# Patient Record
Sex: Female | Born: 1945 | Race: White | Hispanic: No | Marital: Single | Smoking: Former smoker
Health system: Southern US, Community
[De-identification: ages and names within clinical notes are randomized; demographics above are authoritative.]

## PROBLEM LIST (undated history)

## (undated) DIAGNOSIS — K5900 Constipation, unspecified: Secondary | ICD-10-CM

## (undated) DIAGNOSIS — Z9189 Other specified personal risk factors, not elsewhere classified: Secondary | ICD-10-CM

## (undated) DIAGNOSIS — K648 Other hemorrhoids: Secondary | ICD-10-CM

## (undated) DIAGNOSIS — G43909 Migraine, unspecified, not intractable, without status migrainosus: Secondary | ICD-10-CM

## (undated) DIAGNOSIS — Z8619 Personal history of other infectious and parasitic diseases: Secondary | ICD-10-CM

## (undated) DIAGNOSIS — A069 Amebiasis, unspecified: Secondary | ICD-10-CM

## (undated) DIAGNOSIS — B009 Herpesviral infection, unspecified: Secondary | ICD-10-CM

## (undated) DIAGNOSIS — R197 Diarrhea, unspecified: Secondary | ICD-10-CM

## (undated) DIAGNOSIS — R002 Palpitations: Secondary | ICD-10-CM

## (undated) HISTORY — DX: Diarrhea, unspecified: R19.7

## (undated) HISTORY — DX: Personal history of other infectious and parasitic diseases: Z86.19

## (undated) HISTORY — PX: NO PAST SURGERIES: SHX2092

## (undated) HISTORY — DX: Migraine, unspecified, not intractable, without status migrainosus: G43.909

## (undated) HISTORY — DX: Other specified personal risk factors, not elsewhere classified: Z91.89

## (undated) HISTORY — DX: Amebiasis, unspecified: A06.9

## (undated) HISTORY — DX: Palpitations: R00.2

## (undated) HISTORY — DX: Herpesviral infection, unspecified: B00.9

## (undated) HISTORY — DX: Constipation, unspecified: K59.00

## (undated) HISTORY — DX: Other hemorrhoids: K64.8

---

## 2001-01-30 ENCOUNTER — Other Ambulatory Visit: Admission: RE | Admit: 2001-01-30 | Discharge: 2001-01-30 | Payer: Self-pay | Admitting: Obstetrics & Gynecology

## 2001-08-07 ENCOUNTER — Encounter: Payer: Self-pay | Admitting: Obstetrics & Gynecology

## 2001-08-07 ENCOUNTER — Encounter: Admission: RE | Admit: 2001-08-07 | Discharge: 2001-08-07 | Payer: Self-pay | Admitting: Obstetrics & Gynecology

## 2002-04-15 ENCOUNTER — Other Ambulatory Visit: Admission: RE | Admit: 2002-04-15 | Discharge: 2002-04-15 | Payer: Self-pay | Admitting: Obstetrics & Gynecology

## 2002-04-18 ENCOUNTER — Encounter: Admission: RE | Admit: 2002-04-18 | Discharge: 2002-04-18 | Payer: Self-pay | Admitting: Family Medicine

## 2002-04-18 ENCOUNTER — Encounter: Payer: Self-pay | Admitting: Obstetrics & Gynecology

## 2003-05-14 ENCOUNTER — Encounter: Payer: Self-pay | Admitting: Internal Medicine

## 2003-06-19 ENCOUNTER — Encounter (INDEPENDENT_AMBULATORY_CARE_PROVIDER_SITE_OTHER): Payer: Self-pay | Admitting: *Deleted

## 2003-06-19 ENCOUNTER — Ambulatory Visit (HOSPITAL_COMMUNITY): Admission: RE | Admit: 2003-06-19 | Discharge: 2003-06-19 | Payer: Self-pay | Admitting: Gastroenterology

## 2003-06-23 ENCOUNTER — Other Ambulatory Visit: Admission: RE | Admit: 2003-06-23 | Discharge: 2003-06-23 | Payer: Self-pay | Admitting: Obstetrics & Gynecology

## 2004-10-19 ENCOUNTER — Ambulatory Visit: Payer: Self-pay | Admitting: Psychology

## 2005-10-13 ENCOUNTER — Encounter: Admission: RE | Admit: 2005-10-13 | Discharge: 2005-10-13 | Payer: Self-pay | Admitting: Obstetrics & Gynecology

## 2006-12-28 ENCOUNTER — Encounter: Admission: RE | Admit: 2006-12-28 | Discharge: 2006-12-28 | Payer: Self-pay | Admitting: Obstetrics & Gynecology

## 2007-06-27 ENCOUNTER — Encounter: Payer: Self-pay | Admitting: Internal Medicine

## 2008-01-07 ENCOUNTER — Encounter: Payer: Self-pay | Admitting: Internal Medicine

## 2008-01-08 ENCOUNTER — Encounter: Admission: RE | Admit: 2008-01-08 | Discharge: 2008-01-08 | Payer: Self-pay | Admitting: Obstetrics & Gynecology

## 2008-12-24 ENCOUNTER — Encounter: Payer: Self-pay | Admitting: Internal Medicine

## 2008-12-28 ENCOUNTER — Telehealth: Payer: Self-pay | Admitting: Internal Medicine

## 2009-01-08 ENCOUNTER — Encounter: Admission: RE | Admit: 2009-01-08 | Discharge: 2009-01-08 | Payer: Self-pay | Admitting: Obstetrics & Gynecology

## 2009-03-01 DIAGNOSIS — A069 Amebiasis, unspecified: Secondary | ICD-10-CM | POA: Insufficient documentation

## 2009-03-01 DIAGNOSIS — Z9189 Other specified personal risk factors, not elsewhere classified: Secondary | ICD-10-CM | POA: Insufficient documentation

## 2009-03-01 DIAGNOSIS — K648 Other hemorrhoids: Secondary | ICD-10-CM

## 2009-03-01 DIAGNOSIS — G43909 Migraine, unspecified, not intractable, without status migrainosus: Secondary | ICD-10-CM

## 2009-03-01 DIAGNOSIS — B009 Herpesviral infection, unspecified: Secondary | ICD-10-CM

## 2009-03-01 DIAGNOSIS — K5900 Constipation, unspecified: Secondary | ICD-10-CM

## 2009-03-01 DIAGNOSIS — Z8619 Personal history of other infectious and parasitic diseases: Secondary | ICD-10-CM

## 2009-03-01 DIAGNOSIS — R197 Diarrhea, unspecified: Secondary | ICD-10-CM

## 2009-03-01 HISTORY — DX: Other hemorrhoids: K64.8

## 2009-03-01 HISTORY — DX: Amebiasis, unspecified: A06.9

## 2009-03-01 HISTORY — DX: Diarrhea, unspecified: R19.7

## 2009-03-01 HISTORY — DX: Personal history of other infectious and parasitic diseases: Z86.19

## 2009-03-01 HISTORY — DX: Migraine, unspecified, not intractable, without status migrainosus: G43.909

## 2009-03-01 HISTORY — DX: Herpesviral infection, unspecified: B00.9

## 2009-03-01 HISTORY — DX: Constipation, unspecified: K59.00

## 2009-03-01 HISTORY — DX: Other specified personal risk factors, not elsewhere classified: Z91.89

## 2009-04-23 ENCOUNTER — Ambulatory Visit: Payer: Self-pay | Admitting: Internal Medicine

## 2010-01-28 ENCOUNTER — Encounter: Admission: RE | Admit: 2010-01-28 | Discharge: 2010-01-28 | Payer: Self-pay | Admitting: Obstetrics & Gynecology

## 2010-09-23 NOTE — Op Note (Signed)
NAME:  Kayla Lynch, Kayla Lynch                        ACCOUNT NO.:  [id number]   MEDICAL RECORD NO.:  [id number]                   PATIENT TYPE:  AMB   LOCATION:  ENDO                                 FACILITY:  [facility]   PHYSICIAN:  Anselmo Rod, M.D.               DATE OF BIRTH:  [birth year]   DATE OF PROCEDURE:  06/19/2003  DATE OF DISCHARGE:                                 OPERATIVE REPORT   PROCEDURE:  Screening colonoscopy.   ENDOSCOPIST:  Anselmo Rod, M.D.   INSTRUMENT USED:  Olympus video colonoscope.   INDICATIONS FOR PROCEDURE:  A 65 year old white female with a history of  change  in bowel habits, undergoing screening colonoscopy to rule out  colonic polyps, masses, etc.   PREPROCEDURE PREPARATION:  Informed consent was obtained from the patient  and the patient was fasted for 8 hours prior to  the procedure and prepped  with a bottle of magnesium citrate and a gallon of GoLYTELY the  night prior  to the procedure.   PREPROCEDURE PHYSICAL:  The patient had stable vital signs. Neck supple.  Chest clear to auscultation, S1, S2 regular. Abdomen soft with normoactive  bowel sounds.   DESCRIPTION OF PROCEDURE:  The patient was placed in the left lateral  decubitus position and sedated with 60 mg of Demerol and 5 mg of Versed  intravenously. Once sedation was adequate, the patient was maintained on low  flow oxygen and continuous cardiac monitoring.   The Olympus video colonoscope was advanced into the rectum to the cecum with  difficulty. There was a large amount of residual stool into colon. Multiple  washings were done. The terminal ileum appeared  healthy without lesions.  Small  internal hemorrhoids were seen on retroflexion. No masses or polyps  were identified. There was no evidence of diverticulosis. The patient  tolerated the procedure well without complications.   IMPRESSION:  1. Unrevealing colonoscopy except for small  internal hemorrhoids.  2. No masses polyps  or diverticulosis seen.  3. Large amount of residual stool in the colon. Small lesions could have     been missed.  4. Normal terminal ileum.   RECOMMENDATIONS:  1. Continue a high fiber diet with liberal fluid intake.  2. Repeat colorectal cancer screening in the next 5 years unless the patient     develops any abnormal symptoms in the interim.  3. Outpatient follow up in the next 2 weeks or earlier if need arises.                                               Anselmo Rod, M.D.    JNM/MEDQ  D:  06/19/2003  T:  06/19/2003  Job:  [id number]   cc:   Duwayne Heck L. Mahaffey, M.D.  [address]  [place]  [place] [address]  Fax: [phone]

## 2011-01-02 ENCOUNTER — Other Ambulatory Visit: Payer: Self-pay | Admitting: Obstetrics & Gynecology

## 2011-01-02 DIAGNOSIS — Z1231 Encounter for screening mammogram for malignant neoplasm of breast: Secondary | ICD-10-CM

## 2011-02-06 ENCOUNTER — Ambulatory Visit
Admission: RE | Admit: 2011-02-06 | Discharge: 2011-02-06 | Disposition: A | Discharge status: Home / Self Care | Payer: No Typology Code available for payment source | Source: Ambulatory Visit | Attending: Obstetrics & Gynecology | Admitting: Obstetrics & Gynecology

## 2011-02-06 DIAGNOSIS — Z1231 Encounter for screening mammogram for malignant neoplasm of breast: Secondary | ICD-10-CM

## 2012-03-27 ENCOUNTER — Other Ambulatory Visit: Payer: Self-pay | Admitting: Obstetrics & Gynecology

## 2012-03-27 DIAGNOSIS — Z1231 Encounter for screening mammogram for malignant neoplasm of breast: Secondary | ICD-10-CM

## 2012-05-15 ENCOUNTER — Ambulatory Visit
Admission: RE | Admit: 2012-05-15 | Discharge: 2012-05-15 | Disposition: A | Discharge status: Home / Self Care | Payer: BC Managed Care – PPO | Source: Ambulatory Visit | Attending: Obstetrics & Gynecology | Admitting: Obstetrics & Gynecology

## 2012-05-15 DIAGNOSIS — Z1231 Encounter for screening mammogram for malignant neoplasm of breast: Secondary | ICD-10-CM

## 2012-06-18 ENCOUNTER — Emergency Department (HOSPITAL_COMMUNITY)
Admission: EM | Admit: 2012-06-18 | Discharge: 2012-06-19 | Disposition: A | Discharge status: Home / Self Care | Payer: BC Managed Care – PPO | Attending: Emergency Medicine | Admitting: Emergency Medicine

## 2012-06-18 ENCOUNTER — Encounter (HOSPITAL_COMMUNITY): Payer: Self-pay | Admitting: *Deleted

## 2012-06-18 DIAGNOSIS — K089 Disorder of teeth and supporting structures, unspecified: Secondary | ICD-10-CM | POA: Insufficient documentation

## 2012-06-18 DIAGNOSIS — K0889 Other specified disorders of teeth and supporting structures: Secondary | ICD-10-CM

## 2012-06-18 DIAGNOSIS — R5381 Other malaise: Secondary | ICD-10-CM | POA: Insufficient documentation

## 2012-06-18 DIAGNOSIS — R112 Nausea with vomiting, unspecified: Secondary | ICD-10-CM

## 2012-06-18 DIAGNOSIS — R5383 Other fatigue: Secondary | ICD-10-CM | POA: Insufficient documentation

## 2012-06-18 DIAGNOSIS — E860 Dehydration: Secondary | ICD-10-CM

## 2012-06-18 MED ORDER — FENTANYL CITRATE 0.05 MG/ML IJ SOLN
50.0000 ug | Freq: Once | INTRAMUSCULAR | Status: AC
  Administered 2012-06-19: 50 ug via INTRAVENOUS
  Filled 2012-06-18: qty 2

## 2012-06-18 MED ORDER — SODIUM CHLORIDE 0.9 % IV BOLUS (SEPSIS)
1000.0000 mL | Freq: Once | INTRAVENOUS | Status: AC
  Administered 2012-06-19: 1000 mL via INTRAVENOUS

## 2012-06-18 MED ORDER — ONDANSETRON HCL 4 MG/2ML IJ SOLN
4.0000 mg | Freq: Once | INTRAMUSCULAR | Status: AC
  Administered 2012-06-19: 4 mg via INTRAVENOUS
  Filled 2012-06-18: qty 2

## 2012-06-18 NOTE — ED Notes (Signed)
Pt c/o having toothache on Thursday; worse yesterday; seen by dentist this am--cracked tooth--given RX for pain pill and antibiotic; drove home from [place] today; started vomiting on way home; weakness; feels dehydrated

## 2012-06-19 LAB — POCT I-STAT, CHEM 8
BUN: 7 mg/dL (ref 6–23)
Chloride: 99 mEq/L (ref 96–112)
Creatinine, Ser: 0.6 mg/dL (ref 0.50–1.10)
Potassium: 3.5 mEq/L (ref 3.5–5.1)
Sodium: 134 mEq/L — ABNORMAL LOW (ref 135–145)

## 2012-06-19 LAB — URINALYSIS, ROUTINE W REFLEX MICROSCOPIC
Glucose, UA: NEGATIVE mg/dL
Hgb urine dipstick: NEGATIVE
Protein, ur: NEGATIVE mg/dL
Specific Gravity, Urine: 1.016 (ref 1.005–1.030)
pH: 7.5 (ref 5.0–8.0)

## 2012-06-19 MED ORDER — MORPHINE SULFATE 4 MG/ML IJ SOLN
4.0000 mg | Freq: Once | INTRAMUSCULAR | Status: AC
  Administered 2012-06-19: 4 mg via INTRAVENOUS
  Filled 2012-06-19: qty 1

## 2012-06-19 MED ORDER — OXYCODONE-ACETAMINOPHEN 5-325 MG PO TABS: 2.0000 | ORAL_TABLET | ORAL | Status: DC | PRN

## 2012-06-19 MED ORDER — KETOROLAC TROMETHAMINE 30 MG/ML IJ SOLN
30.0000 mg | Freq: Once | INTRAMUSCULAR | Status: AC
  Administered 2012-06-19: 30 mg via INTRAVENOUS
  Filled 2012-06-19: qty 1

## 2012-06-19 MED ORDER — ONDANSETRON HCL 4 MG PO TABS: 4.0000 mg | ORAL_TABLET | Freq: Four times a day (QID) | ORAL | Status: DC

## 2012-06-19 MED ORDER — SODIUM CHLORIDE 0.9 % IV BOLUS (SEPSIS)
1000.0000 mL | Freq: Once | INTRAVENOUS | Status: AC
  Administered 2012-06-19: 1000 mL via INTRAVENOUS

## 2012-06-19 MED ORDER — CLINDAMYCIN PHOSPHATE 600 MG/50ML IV SOLN
600.0000 mg | Freq: Once | INTRAVENOUS | Status: AC
  Administered 2012-06-19: 600 mg via INTRAVENOUS
  Filled 2012-06-19: qty 50

## 2012-06-19 MED ORDER — PROMETHAZINE HCL 25 MG PO TABS: 25.0000 mg | ORAL_TABLET | Freq: Four times a day (QID) | ORAL | Status: DC | PRN

## 2012-06-19 NOTE — ED Provider Notes (Signed)
History     CSN: [id number]  Arrival date & time 06/18/12  2336   First MD Initiated Contact with Patient 06/18/12 2349      Chief Complaint  Patient presents with  . Emesis    (Consider location/radiation/quality/duration/timing/severity/associated sxs/prior treatment) HPI D67-year-old female presents to emergency room complaining of nausea and vomiting and tooth pain. Patient was seen at dentist or earlier today, told she had a cracked tooth. She was placed on Vicodin and Cleocin. Patient has taken a couple doses of each, and during drive home from [place] developed nausea and vomiting. Patient feels dehydrated. She denies any fever. Pain to left lower tooth. She's also had some soft tissue swelling under her jaw. She was told by the dentist earlier today, that she probably has an abscess. She has followup with her own dentist at 9 AM later today. No abdominal pain, no diarrhea. No unusual foods, no sick contacts. Patient has not taken clindamycin or Vicodin in the past    History reviewed. No pertinent past medical history.  History reviewed. No pertinent past surgical history.  No family history on file.  History  Substance Use Topics  . Smoking status: Never Smoker   . Smokeless tobacco: Never Used  . Alcohol Use: Yes     Comment: socially    OB History   Grav Para Term Preterm Abortions TAB SAB Ect Mult Living                  Review of Systems  See History of Present Illness; otherwise all other systems are reviewed and negative  Allergies  Review of patient's allergies indicates no known allergies.  Home Medications   Current Outpatient Rx  Name  Route  Sig  Dispense  Refill  . clindamycin (CLEOCIN) 150 MG capsule   Oral   Take 150 mg by mouth 3 (three) times daily.         Marland Kitchen estradiol (CLIMARA - DOSED IN MG/24 HR) 0.075 mg/24hr   Transdermal   Place 1 patch onto the skin once a week.         Marland Kitchen HYDROcodone-acetaminophen (NORCO/VICODIN) 5-325 MG per  tablet   Oral   Take 1 tablet by mouth every 6 (six) hours as needed for pain.         . progesterone (PROMETRIUM) 100 MG capsule   Oral   Take 100 mg by mouth daily.           BP 159/72  Pulse 108  Temp(Src) 98 F (36.7 C) (Oral)  Resp 20  SpO2 97%  Physical Exam  Nursing note and vitals reviewed. Constitutional: She is oriented to person, place, and time. She appears well-developed and well-nourished.  HENT:  Head: Normocephalic and atraumatic.  Right Ear: External ear normal.  Left Ear: External ear normal.  Nose: Nose normal.  Mouth/Throat: Oropharynx is clear and moist.  No buccal swelling, no obvious abscess or edema noted  Eyes: Conjunctivae and EOM are normal. Pupils are equal, round, and reactive to light.  Neck: Normal range of motion. Neck supple. No JVD present. No tracheal deviation present. No thyromegaly present.  Some lymphadenopathy along left submental and paracervical region  Cardiovascular: Normal rate, regular rhythm, normal heart sounds and intact distal pulses.  Exam reveals no gallop and no friction rub.   No murmur heard. Pulmonary/Chest: Effort normal and breath sounds normal. No stridor. No respiratory distress. She has no wheezes. She has no rales. She exhibits no tenderness.  Abdominal: Soft. Bowel sounds are normal. She exhibits no distension and no mass. There is no tenderness. There is no rebound and no guarding.  Musculoskeletal: Normal range of motion. She exhibits no edema and no tenderness.  Lymphadenopathy:    She has no cervical adenopathy.  Neurological: She is alert and oriented to person, place, and time. She exhibits normal muscle tone. Coordination normal.  Skin: Skin is warm and dry. No rash noted. No erythema. No pallor.  Psychiatric: She has a normal mood and affect. Her behavior is normal. Judgment and thought content normal.    ED Course  Procedures (including critical care time)  Labs Reviewed  URINALYSIS, ROUTINE W  REFLEX MICROSCOPIC - Abnormal; Notable for the following:    Ketones, ur 40 (*)    All other components within normal limits  POCT I-STAT, CHEM 8 - Abnormal; Notable for the following:    Sodium 134 (*)    Glucose, Bld 138 (*)    All other components within normal limits   No results found.   1. Nausea and vomiting   2. Pain, dental   3. Dehydration       MDM  67 year old female with dental pain with new medications that most likely caused her nausea and vomiting today. We'll give IV fluids and nausea medicine.   Patient reports worsening of dental pain after fentanyl were offered. We'll give a dose of Toradol and morphine. Patient has not taken a dose of clindamycin since 3 PM, will give an IV dose.       Olivia Mackie, MD 06/19/12 [phone]

## 2013-05-13 ENCOUNTER — Other Ambulatory Visit: Payer: Self-pay

## 2013-05-13 DIAGNOSIS — Z1231 Encounter for screening mammogram for malignant neoplasm of breast: Secondary | ICD-10-CM

## 2013-05-23 ENCOUNTER — Ambulatory Visit: Payer: Self-pay | Admitting: Podiatrist

## 2013-05-26 DIAGNOSIS — B373 Candidiasis of vulva and vagina: Secondary | ICD-10-CM | POA: Diagnosis not present

## 2013-05-26 DIAGNOSIS — B3731 Acute candidiasis of vulva and vagina: Secondary | ICD-10-CM | POA: Diagnosis not present

## 2013-06-03 ENCOUNTER — Ambulatory Visit: Payer: BC Managed Care – PPO

## 2013-06-09 ENCOUNTER — Ambulatory Visit
Admission: RE | Admit: 2013-06-09 | Discharge: 2013-06-09 | Disposition: A | Discharge status: Home / Self Care | Payer: Medicare Other | Source: Ambulatory Visit

## 2013-06-09 DIAGNOSIS — Z1231 Encounter for screening mammogram for malignant neoplasm of breast: Secondary | ICD-10-CM | POA: Diagnosis not present

## 2013-06-11 ENCOUNTER — Ambulatory Visit: Payer: Self-pay | Admitting: Podiatrist

## 2013-06-24 ENCOUNTER — Ambulatory Visit
Admission: RE | Admit: 2013-06-24 | Discharge: 2013-06-24 | Disposition: A | Discharge status: Home / Self Care | Payer: Medicare Other | Source: Ambulatory Visit | Attending: Family Medicine | Admitting: Family Medicine

## 2013-06-24 ENCOUNTER — Other Ambulatory Visit: Payer: Self-pay | Admitting: Family Medicine

## 2013-06-24 DIAGNOSIS — R0789 Other chest pain: Secondary | ICD-10-CM

## 2013-06-24 DIAGNOSIS — R0602 Shortness of breath: Secondary | ICD-10-CM | POA: Diagnosis not present

## 2013-06-30 DIAGNOSIS — Z01419 Encounter for gynecological examination (general) (routine) without abnormal findings: Secondary | ICD-10-CM | POA: Diagnosis not present

## 2013-06-30 DIAGNOSIS — Z124 Encounter for screening for malignant neoplasm of cervix: Secondary | ICD-10-CM | POA: Diagnosis not present

## 2013-07-07 DIAGNOSIS — Z13220 Encounter for screening for lipoid disorders: Secondary | ICD-10-CM | POA: Diagnosis not present

## 2013-07-07 DIAGNOSIS — Z1389 Encounter for screening for other disorder: Secondary | ICD-10-CM | POA: Diagnosis not present

## 2013-07-07 DIAGNOSIS — Z0000 Encounter for general adult medical examination without abnormal findings: Secondary | ICD-10-CM | POA: Diagnosis not present

## 2014-01-21 DIAGNOSIS — B351 Tinea unguium: Secondary | ICD-10-CM | POA: Diagnosis not present

## 2014-01-21 DIAGNOSIS — L259 Unspecified contact dermatitis, unspecified cause: Secondary | ICD-10-CM | POA: Diagnosis not present

## 2014-01-21 DIAGNOSIS — L821 Other seborrheic keratosis: Secondary | ICD-10-CM | POA: Diagnosis not present

## 2014-02-18 DIAGNOSIS — B349 Viral infection, unspecified: Secondary | ICD-10-CM | POA: Diagnosis not present

## 2014-02-18 DIAGNOSIS — Z23 Encounter for immunization: Secondary | ICD-10-CM | POA: Diagnosis not present

## 2014-02-23 DIAGNOSIS — J101 Influenza due to other identified influenza virus with other respiratory manifestations: Secondary | ICD-10-CM | POA: Diagnosis not present

## 2014-02-27 ENCOUNTER — Encounter: Payer: Self-pay | Admitting: *Deleted

## 2014-03-05 DIAGNOSIS — J029 Acute pharyngitis, unspecified: Secondary | ICD-10-CM | POA: Diagnosis not present

## 2014-03-05 DIAGNOSIS — R52 Pain, unspecified: Secondary | ICD-10-CM | POA: Diagnosis not present

## 2014-03-05 DIAGNOSIS — R079 Chest pain, unspecified: Secondary | ICD-10-CM | POA: Diagnosis not present

## 2014-07-28 ENCOUNTER — Other Ambulatory Visit: Payer: Self-pay

## 2014-07-28 DIAGNOSIS — Z1231 Encounter for screening mammogram for malignant neoplasm of breast: Secondary | ICD-10-CM

## 2014-08-10 ENCOUNTER — Ambulatory Visit
Admission: RE | Admit: 2014-08-10 | Discharge: 2014-08-10 | Disposition: A | Discharge status: Home / Self Care | Payer: Medicare Other | Source: Ambulatory Visit

## 2014-08-10 ENCOUNTER — Encounter (INDEPENDENT_AMBULATORY_CARE_PROVIDER_SITE_OTHER): Payer: Self-pay

## 2014-08-10 DIAGNOSIS — Z1231 Encounter for screening mammogram for malignant neoplasm of breast: Secondary | ICD-10-CM | POA: Diagnosis not present

## 2014-08-24 DIAGNOSIS — J209 Acute bronchitis, unspecified: Secondary | ICD-10-CM | POA: Diagnosis not present

## 2014-08-24 DIAGNOSIS — J329 Chronic sinusitis, unspecified: Secondary | ICD-10-CM | POA: Diagnosis not present

## 2014-09-03 DIAGNOSIS — Z124 Encounter for screening for malignant neoplasm of cervix: Secondary | ICD-10-CM | POA: Diagnosis not present

## 2014-09-03 DIAGNOSIS — R87610 Atypical squamous cells of undetermined significance on cytologic smear of cervix (ASC-US): Secondary | ICD-10-CM | POA: Diagnosis not present

## 2014-09-03 DIAGNOSIS — Z01419 Encounter for gynecological examination (general) (routine) without abnormal findings: Secondary | ICD-10-CM | POA: Diagnosis not present

## 2014-09-25 DIAGNOSIS — J329 Chronic sinusitis, unspecified: Secondary | ICD-10-CM | POA: Diagnosis not present

## 2014-10-12 IMAGING — CR DG CHEST 2V
2 series · 2 of 2 positions shown · non-contrast
Comparison: 04/15/2009

CLINICAL DATA: Shortness of Breath

EXAM:
CHEST  2 VIEW

[w chest pa]
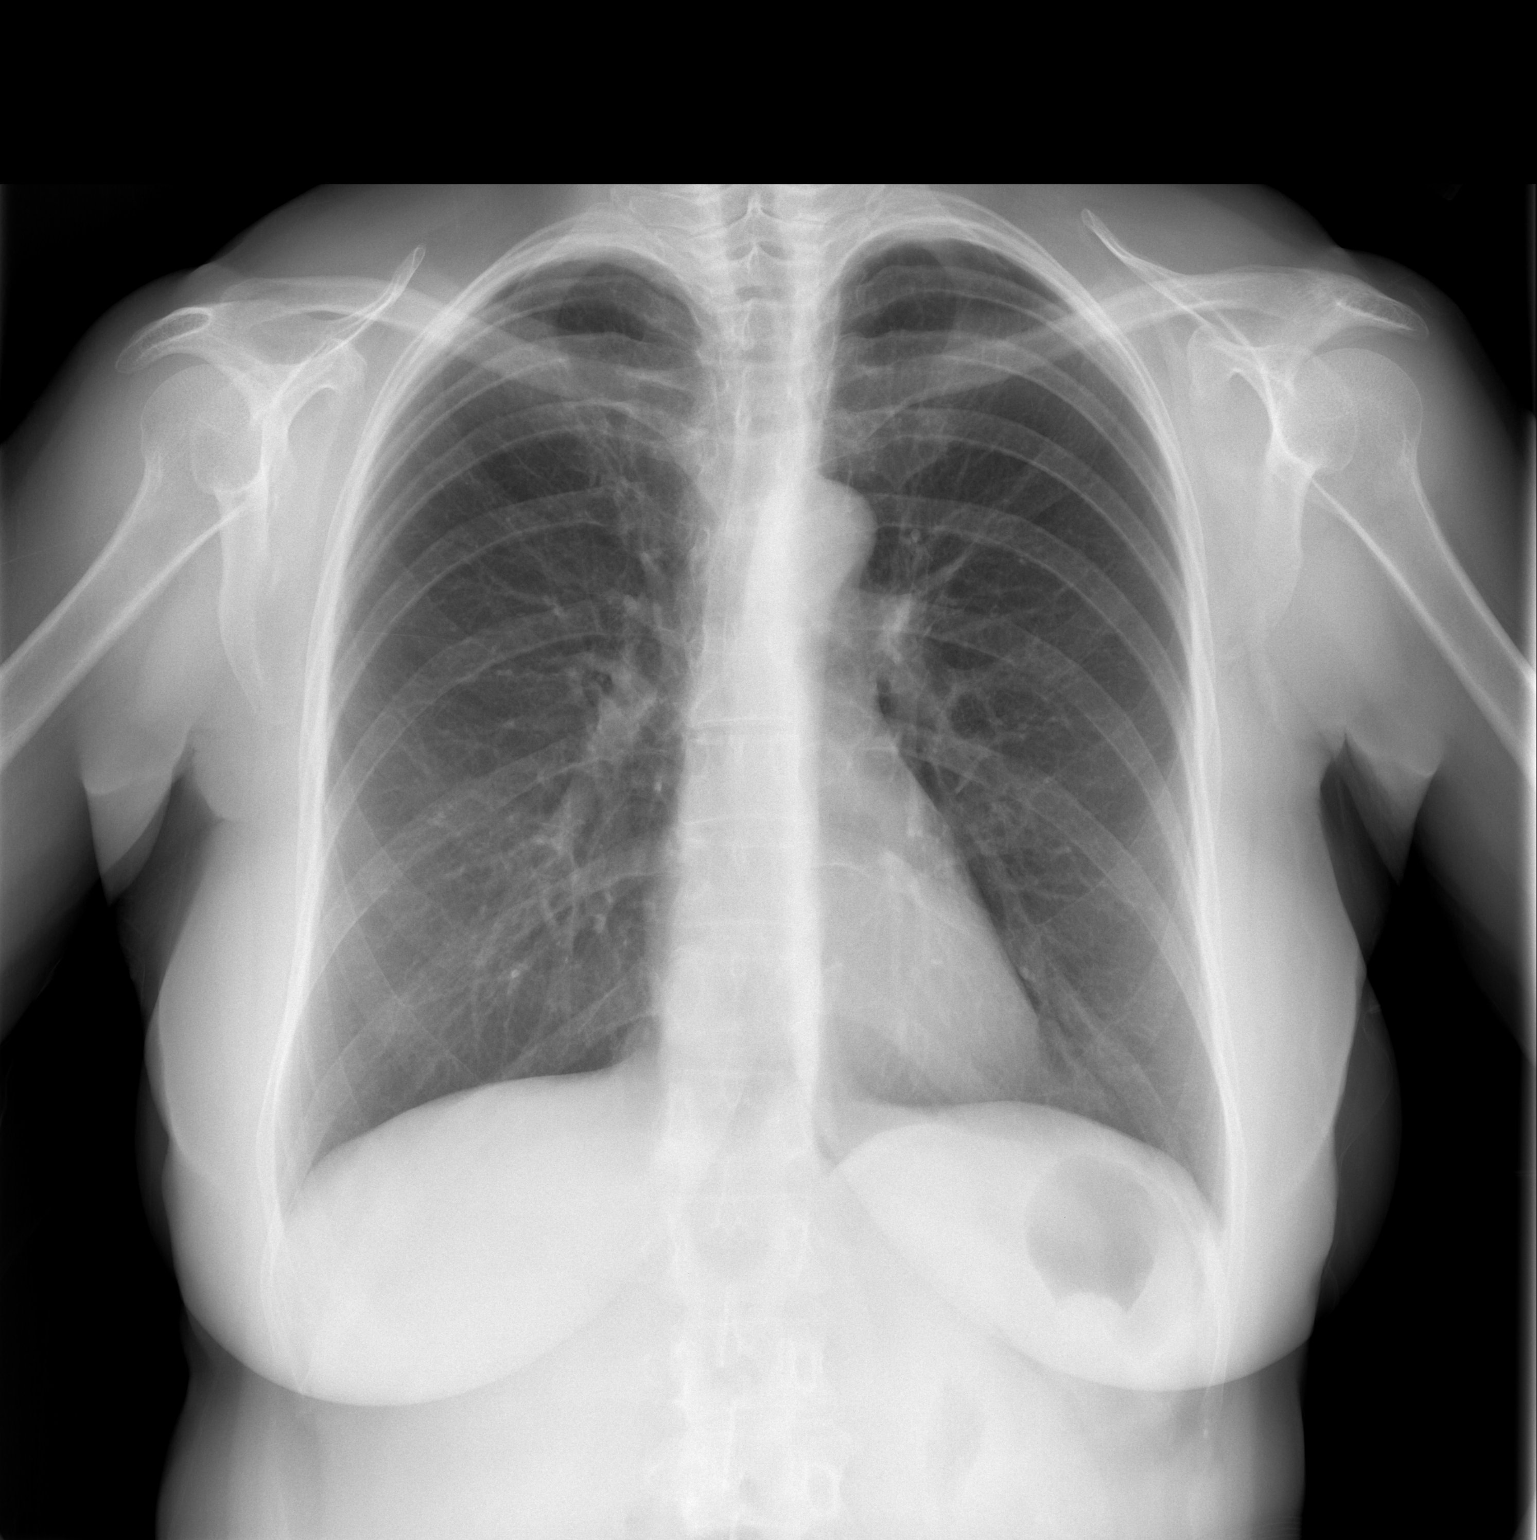

[w chest lat]
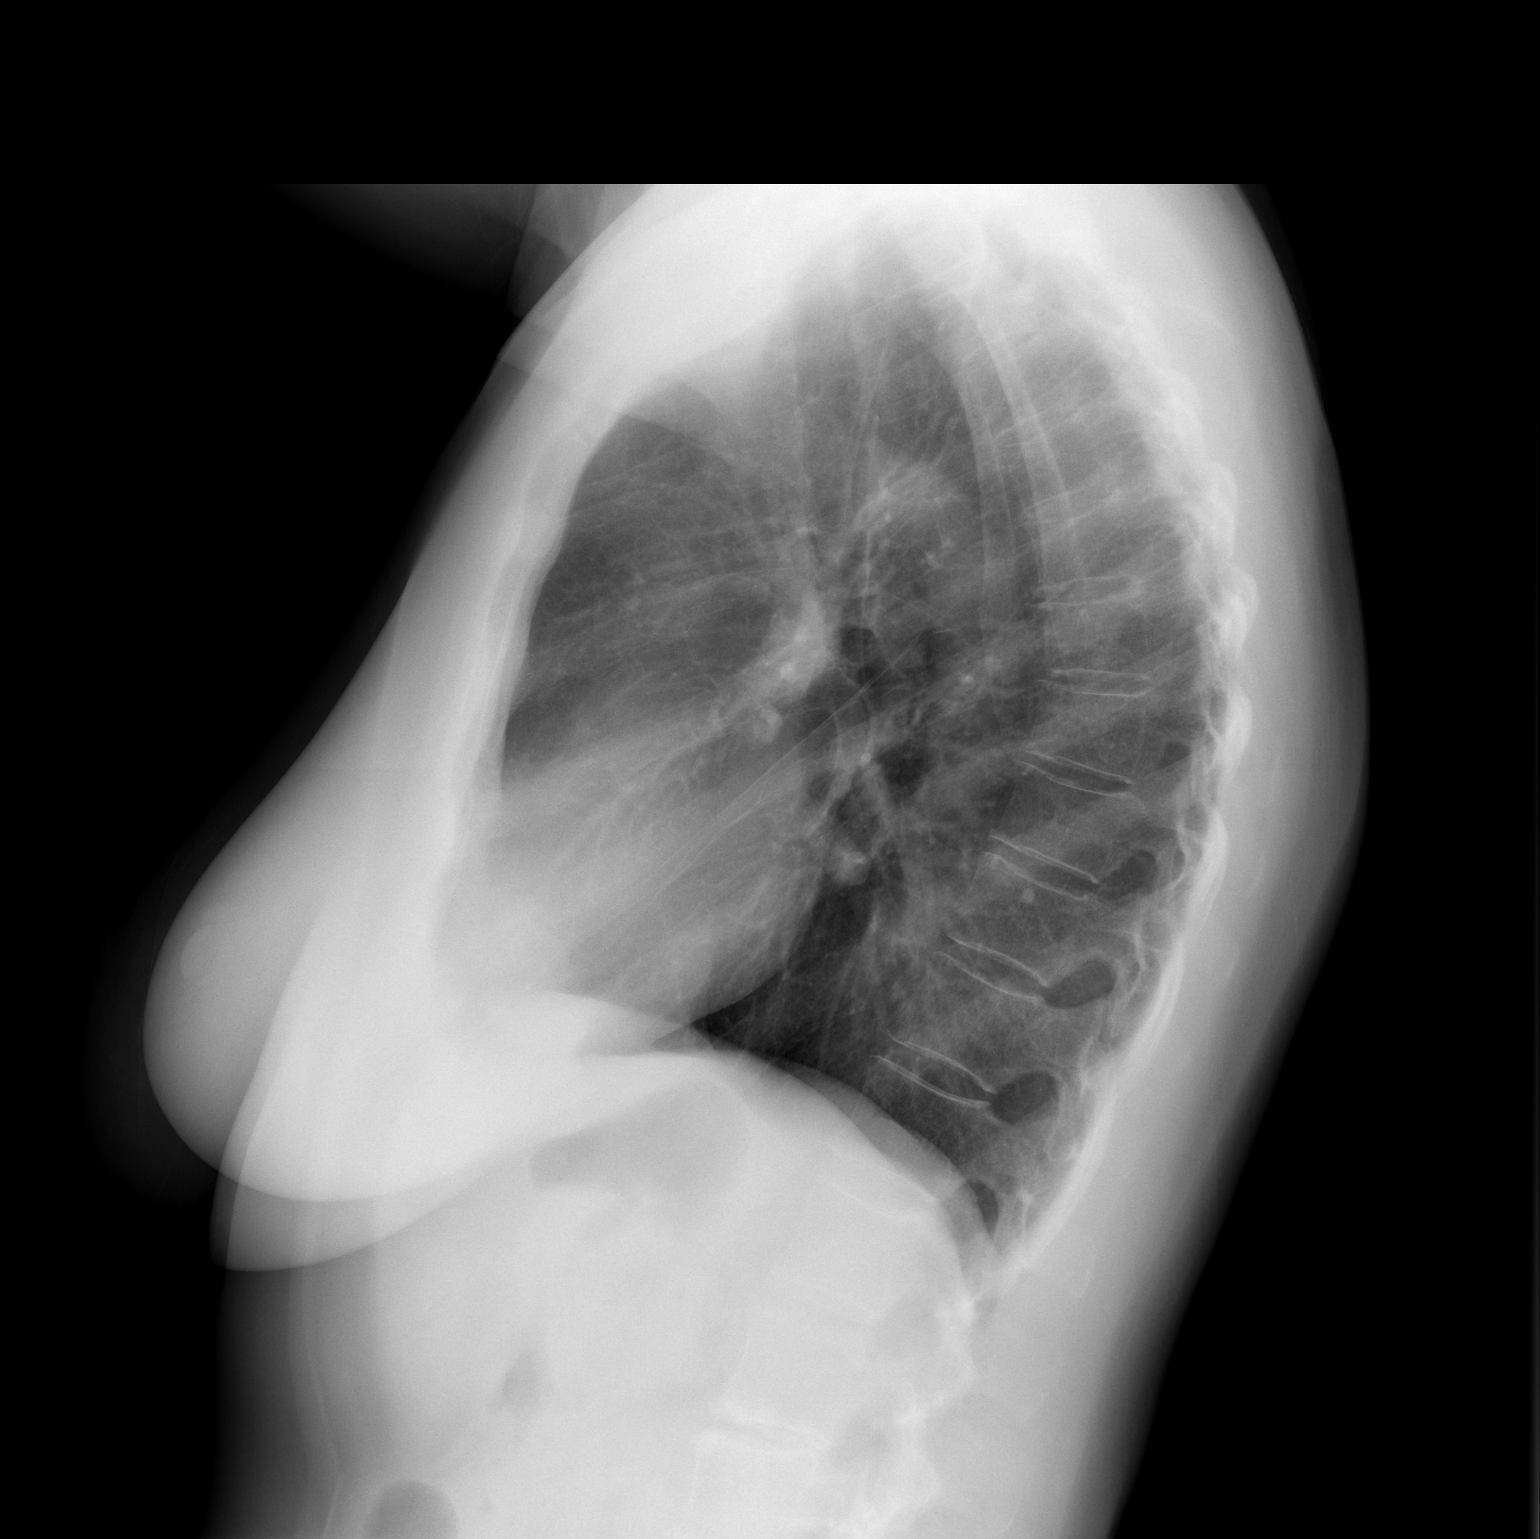

[2 of 2 positions shown; findings below may reference images not displayed]

FINDINGS: Cardiomediastinal silhouette is stable. No acute infiltrate or
pleural effusion. No pulmonary edema. Bony thorax is unremarkable.
IMPRESSION: No active cardiopulmonary disease.

## 2015-05-20 DIAGNOSIS — Z23 Encounter for immunization: Secondary | ICD-10-CM | POA: Diagnosis not present

## 2015-08-09 ENCOUNTER — Other Ambulatory Visit: Payer: Self-pay

## 2015-08-09 DIAGNOSIS — Z1231 Encounter for screening mammogram for malignant neoplasm of breast: Secondary | ICD-10-CM

## 2015-08-26 ENCOUNTER — Ambulatory Visit: Payer: Medicare Other

## 2015-10-21 DIAGNOSIS — R002 Palpitations: Secondary | ICD-10-CM | POA: Diagnosis not present

## 2015-10-21 DIAGNOSIS — R0602 Shortness of breath: Secondary | ICD-10-CM | POA: Diagnosis not present

## 2015-10-26 ENCOUNTER — Ambulatory Visit
Admission: RE | Admit: 2015-10-26 | Discharge: 2015-10-26 | Disposition: A | Discharge status: Home / Self Care | Payer: Medicare Other | Source: Ambulatory Visit

## 2015-10-26 DIAGNOSIS — Z1231 Encounter for screening mammogram for malignant neoplasm of breast: Secondary | ICD-10-CM

## 2015-10-26 DIAGNOSIS — R002 Palpitations: Secondary | ICD-10-CM | POA: Insufficient documentation

## 2015-10-26 HISTORY — DX: Palpitations: R00.2

## 2015-10-27 ENCOUNTER — Encounter: Payer: Self-pay | Admitting: *Deleted

## 2015-10-27 NOTE — Progress Notes (Signed)
Patient ID: Kayla Lynch, female   DOB: August 17, 1945, 70 y.o.   MRN: [id number] Patient did not show up for 10/27/15, 3:00 PM, appointment to have a 48 hour holter monitor applied.

## 2015-11-11 DIAGNOSIS — L814 Other melanin hyperpigmentation: Secondary | ICD-10-CM | POA: Diagnosis not present

## 2015-11-11 DIAGNOSIS — L821 Other seborrheic keratosis: Secondary | ICD-10-CM | POA: Diagnosis not present

## 2015-11-11 DIAGNOSIS — L309 Dermatitis, unspecified: Secondary | ICD-10-CM | POA: Diagnosis not present

## 2015-11-11 DIAGNOSIS — D225 Melanocytic nevi of trunk: Secondary | ICD-10-CM | POA: Diagnosis not present

## 2015-11-25 ENCOUNTER — Encounter (INDEPENDENT_AMBULATORY_CARE_PROVIDER_SITE_OTHER): Payer: Self-pay

## 2015-11-25 ENCOUNTER — Ambulatory Visit: Payer: Medicare Other

## 2015-11-25 ENCOUNTER — Ambulatory Visit
Admission: RE | Admit: 2015-11-25 | Discharge: 2015-11-25 | Disposition: A | Discharge status: Home / Self Care | Payer: Medicare Other | Source: Ambulatory Visit | Attending: Family Medicine | Admitting: Family Medicine

## 2015-11-25 ENCOUNTER — Other Ambulatory Visit: Payer: Self-pay | Admitting: Family Medicine

## 2015-11-25 DIAGNOSIS — R0602 Shortness of breath: Secondary | ICD-10-CM | POA: Diagnosis not present

## 2015-11-25 DIAGNOSIS — R079 Chest pain, unspecified: Secondary | ICD-10-CM | POA: Diagnosis not present

## 2015-11-30 ENCOUNTER — Ambulatory Visit (INDEPENDENT_AMBULATORY_CARE_PROVIDER_SITE_OTHER): Payer: Medicare Other

## 2015-11-30 DIAGNOSIS — R002 Palpitations: Secondary | ICD-10-CM

## 2016-02-01 ENCOUNTER — Encounter (INDEPENDENT_AMBULATORY_CARE_PROVIDER_SITE_OTHER): Payer: Self-pay

## 2016-02-01 ENCOUNTER — Encounter: Payer: Self-pay | Admitting: Interventional Cardiology

## 2016-02-01 ENCOUNTER — Ambulatory Visit (INDEPENDENT_AMBULATORY_CARE_PROVIDER_SITE_OTHER): Payer: Medicare Other | Admitting: Interventional Cardiology

## 2016-02-01 VITALS — BP 140/84 | HR 83 | Ht 63.0 in | Wt 132.6 lb

## 2016-02-01 DIAGNOSIS — I4891 Unspecified atrial fibrillation: Secondary | ICD-10-CM

## 2016-02-01 DIAGNOSIS — R0789 Other chest pain: Secondary | ICD-10-CM | POA: Diagnosis not present

## 2016-02-01 DIAGNOSIS — R002 Palpitations: Secondary | ICD-10-CM | POA: Diagnosis not present

## 2016-02-01 MED ORDER — ASPIRIN EC 81 MG PO TBEC: 81.0000 mg | DELAYED_RELEASE_TABLET | Freq: Every day | ORAL | Status: AC

## 2016-02-01 NOTE — Progress Notes (Addendum)
Cardiology Office Note    Date:  02/01/2016   ID:  Kayla, Lynch [birth year], MRN [id number]  PCP:  London Pepper, MD  Cardiologist: Sinclair Grooms, MD   Chief Complaint  Patient presents with  . Tachycardia  . Palpitations  . Chest Pain    Heaviness    History of Present Illness:  Kayla Lynch is a 70 y.o. female referred for evaluation of tachycardia.  She is referred by Dr. Anastasia Pall evaluation of tachycardia. She describes episodes where there is chest tightness, dyspnea, and heart rates greater than 130 bpm. These episodes may last up to 10 or 15 minutes. They resolve spontaneously. She has occasionally awakened from sleep by this complaint. There is no exertional component. She has on occasion had it after exercising. Episodes are relatively infrequent and may occur once or twice a month. It is been going on for 12 months or less. A recent 48-hour Holter monitor was performed and demonstrated episodes of SVT up to 12 beats, asymptomatic. No definite atrial fibrillation was identified. Occasional sinus pauses were noted. No pauses greater than 2 seconds.  She has had a prior history of chest heaviness. There is some exertional component. 2-3 years ago this was evaluated at [facility]". A nuclear perfusion study was performed and was "normal".    Past Medical History:  Diagnosis Date  . AMEBIASIS 03/01/2009   Qualifier: History of  By: Nelson-Trashawn Oquendo CMA (AAMA), Dottie    . CHICKENPOX, HX OF 03/01/2009   Qualifier: Diagnosis of  By: Nelson-Reygan Heagle CMA (AAMA), Dottie    . CONSTIPATION 03/01/2009   Qualifier: Diagnosis of  By: Nelson-Mayson Sterbenz CMA (AAMA), Dottie    . Diarrhea 03/01/2009   Qualifier: Diagnosis of  By: Nelson-Misti Towle CMA (AAMA), Dottie    . 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 INTERNAL HEMORRHOIDS 03/01/2009   Qualifier: Diagnosis of  By: Nelson-Dania Marsan CMA (AAMA), Dottie    . MIGRAINE HEADACHE  03/01/2009   Qualifier: Diagnosis of  By: Nelson-Abdelaziz Westenberger CMA (AAMA), Dottie    . Palpitations 10/26/2015  . Personal history of other infectious and parasitic disease 03/01/2009   Centricity Description: MUMPS, HX OF Qualifier: Diagnosis of  By: Nelson-Wynette Jersey CMA (AAMA), Dottie   Centricity Description: MEASLES, HX OF Qualifier: Diagnosis of  By: Nelson-Wanetta Funderburke CMA (AAMA), Dottie      Past Surgical History:  Procedure Laterality Date  . NO PAST SURGERIES      Current Medications: Outpatient Medications Prior to Visit  Medication Sig Dispense Refill  . estradiol (CLIMARA - DOSED IN MG/24 HR) 0.075 mg/24hr Place 1 patch onto the skin 2 (two) times a week.     . progesterone (PROMETRIUM) 100 MG capsule Take 100 mg by mouth daily.     No facility-administered medications prior to visit.      Allergies:   Clindamycin/lincomycin   Social History   Social History  . Marital status: Single    Spouse name: N/A  . Number of children: N/A  . Years of education: N/A   Social History Main Topics  . Smoking status: Former Research scientist (life sciences)  . Smokeless tobacco: Never Used  . Alcohol use Yes     Comment: socially  . Drug use: No  . Sexual activity: Not Asked   Other Topics Concern  . None   Social History Narrative  . None     Family History:  The patient's family history includes CVA in her  mother; Coronary artery disease in her father; Diabetes in her brother, father, and mother.   ROS:   Please see the history of present illness.    Concern about progressive fatigue and she ages. Snoring. She is concerned she may have a hiatal hernia. All other systems reviewed and are negative.   PHYSICAL EXAM:   VS:  BP 140/84   Pulse 83   Ht 5\' 3"  (1.6 m)   Wt 132 lb 9.6 oz (60.1 kg)   BMI 23.49 kg/m    GEN: Well nourished, well developed, in no acute distress  HEENT: normal  Neck: no JVD, carotid bruits, or masses Cardiac: RRR; no murmurs, rubs, or gallops,no edema  Respiratory:  clear to  auscultation bilaterally, normal work of breathing GI: soft, nontender, nondistended, + BS MS: no deformity or atrophy  Skin: warm and dry, no rash Neuro:  Alert and Oriented x 3, Strength and sensation are intact Psych: euthymic mood, full affect  Wt Readings from Last 3 Encounters:  02/01/16 132 lb 9.6 oz (60.1 kg)  04/23/09 128 lb (58.1 kg)      Studies/Labs Reviewed:   EKG:  EKG  Normal sinus rhythm and normal tracing performed this morning.  Recent Labs: No results found for requested labs within last 8760 hours.   Lipid Panel No results found for: CHOL, TRIG, HDL, CHOLHDL, VLDL, LDLCALC, LDLDIRECT  Additional studies/ records that were reviewed today include:  48-hour Holter results reviewed and noted above.    ASSESSMENT:    1. Heart palpitations   2. Atrial fibrillation, unspecified type ([place])   3. Chest pressure      PLAN:  In order of problems listed above:  1. We need to exclude the possibility of significant ventricular or atrial arrhythmias when she has the prolonged episodes. For this reason, a 30 day continuous ambulatory monitor will be performed. 2. Because of concern for possible atrial fibrillation, we'll go ahead and start aspirin 81 mg per day. We need to document structural integrity of the heart with an echocardiogram. 3. Need to obtain information concerning the workup for chest pressure that was done several years ago at [facility]. Consider coronary calcium score. Consider aggressive lipid-lowering    Medication Adjustments/Labs and Tests Ordered: Current medicines are reviewed at length with the patient today.  Concerns regarding medicines are outlined above.  Medication changes, Labs and Tests ordered today are listed in the Patient Instructions below. Patient Instructions  Medication Instructions:  START Aspirin 81mg  daily  Labwork: None ordered  Testing/Procedures: Your physician has requested that you have an echocardiogram.  Echocardiography is a painless test that uses sound waves to create images of your heart. It provides your doctor with information about the size and shape of your heart and how well your heart's chambers and valves are working. This procedure takes approximately one hour. There are no restrictions for this procedure.  Your physician has recommended that you wear an event monitor. Event monitors are medical devices that record the heart's electrical activity. Doctors most often [place] these monitors to diagnose arrhythmias. Arrhythmias are problems with the speed or rhythm of the heartbeat. The monitor is a small, portable device. You can wear one while you do your normal daily activities. This is usually used to diagnose what is causing palpitations/syncope (passing out).    Follow-Up: Your physician recommends that you schedule a follow-up appointment in: as needed pending results   Any Other Special Instructions Will Be Listed Below (If Applicable).  If you need a refill on your cardiac medications before your next appointment, please call your pharmacy.      Signed, Sinclair Grooms, MD  02/01/2016 10:38 AM    [facility] [place], [place], [place]  [address] Phone: [phone]; Fax: [phone]

## 2016-02-01 NOTE — Patient Instructions (Signed)
Medication Instructions:  START Aspirin 81mg  daily  Labwork: None ordered  Testing/Procedures: Your physician has requested that you have an echocardiogram. Echocardiography is a painless test that uses sound waves to create images of your heart. It provides your doctor with information about the size and shape of your heart and how well your heart's chambers and valves are working. This procedure takes approximately one hour. There are no restrictions for this procedure.  Your physician has recommended that you wear an event monitor. Event monitors are medical devices that record the heart's electrical activity. Doctors most often [place] these monitors to diagnose arrhythmias. Arrhythmias are problems with the speed or rhythm of the heartbeat. The monitor is a small, portable device. You can wear one while you do your normal daily activities. This is usually used to diagnose what is causing palpitations/syncope (passing out).    Follow-Up: Your physician recommends that you schedule a follow-up appointment in: as needed pending results   Any Other Special Instructions Will Be Listed Below (If Applicable).     If you need a refill on your cardiac medications before your next appointment, please call your pharmacy.

## 2016-02-28 ENCOUNTER — Ambulatory Visit (HOSPITAL_COMMUNITY): Payer: Medicare Other | Attending: Cardiovascular Disease

## 2016-02-28 ENCOUNTER — Ambulatory Visit (INDEPENDENT_AMBULATORY_CARE_PROVIDER_SITE_OTHER): Payer: Medicare Other

## 2016-02-28 DIAGNOSIS — I4891 Unspecified atrial fibrillation: Secondary | ICD-10-CM | POA: Insufficient documentation

## 2016-02-28 DIAGNOSIS — R002 Palpitations: Secondary | ICD-10-CM | POA: Diagnosis not present

## 2016-02-29 DIAGNOSIS — Z23 Encounter for immunization: Secondary | ICD-10-CM | POA: Diagnosis not present

## 2016-04-11 ENCOUNTER — Telehealth: Payer: Self-pay | Admitting: Interventional Cardiology

## 2016-04-11 MED ORDER — METOPROLOL SUCCINATE ER 25 MG PO TB24: 25.0000 mg | ORAL_TABLET | Freq: Every day | ORAL | 3 refills | Status: AC

## 2016-04-11 NOTE — Telephone Encounter (Signed)
Informed pt of monitor results and new orders.  Pt verbalized understanding and was in agreement with this plan. Pt was driving so she plans to call back once she gets home to make an appt with a PA, NP or Dr. Tamala Julian in 6-8 weeks.

## 2016-04-11 NOTE — Telephone Encounter (Signed)
New message ° ° ° °Pt verbalized that she is returning call for rn  °

## 2016-05-20 NOTE — Progress Notes (Deleted)
Cardiology Office Note    Date:  05/20/2016   ID:  Mauria, Shontz 1946-02-29, MRN [id number]  PCP:  London Pepper, MD  Cardiologist:  Dr. Tamala Julian   CC: follow up  History of Present Illness:  Kayla Lynch is a 71 y.o. female with a history of recently diagnosed SVT/atrial tach who presents to clinic for follow up.   She was recently seen by her PCP Dr. Orland Mustard for evaluation of palpitations. Holter monitor showed short runs of SVT. She was referred to Dr. Tamala Julian who saw her in the office on 02/01/16. He ordered a 30 day event monitor which showed sinus tach vs atrial tach (HRs 140-155) which correlated to her palpitations. She was started on Toprol XL 25mg  daily. 2D ECHO 02/28/16 showed normal LV function, G1DD, and mild TR.  Today she presents to clinic for follow up.     Past Medical History:  Diagnosis Date  . AMEBIASIS 03/01/2009   Qualifier: History of  By: Nelson-Smith CMA (AAMA), Dottie    . CHICKENPOX, HX OF 03/01/2009   Qualifier: Diagnosis of  By: Nelson-Smith CMA (AAMA), Dottie    . CONSTIPATION 03/01/2009   Qualifier: Diagnosis of  By: Nelson-Smith CMA (AAMA), Dottie    . Diarrhea 03/01/2009   Qualifier: Diagnosis of  By: Nelson-Smith CMA (AAMA), Dottie    . 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 INTERNAL HEMORRHOIDS 03/01/2009   Qualifier: Diagnosis of  By: Nelson-Smith CMA (AAMA), Dottie    . MIGRAINE HEADACHE 03/01/2009   Qualifier: Diagnosis of  By: Nelson-Smith CMA (AAMA), Dottie    . Palpitations 10/26/2015  . Personal history of other infectious and parasitic disease 03/01/2009   Centricity Description: MUMPS, HX OF Qualifier: Diagnosis of  By: Nelson-Smith CMA (AAMA), Dottie   Centricity Description: MEASLES, HX OF Qualifier: Diagnosis of  By: Nelson-Smith CMA (AAMA), Dottie      Past Surgical History:  Procedure Laterality Date  . NO PAST SURGERIES      Current Medications: Outpatient Medications  Prior to Visit  Medication Sig Dispense Refill  . aspirin EC 81 MG tablet Take 1 tablet (81 mg total) by mouth daily.    Marland Kitchen estradiol (CLIMARA - DOSED IN MG/24 HR) 0.075 mg/24hr Place 1 patch onto the skin 2 (two) times a week.     . metoprolol succinate (TOPROL-XL) 25 MG 24 hr tablet Take 1 tablet (25 mg total) by mouth daily. 90 tablet 3  . progesterone (PROMETRIUM) 100 MG capsule Take 100 mg by mouth daily.     No facility-administered medications prior to visit.      Allergies:   Clindamycin/lincomycin   Social History   Social History  . Marital status: Single    Spouse name: N/A  . Number of children: N/A  . Years of education: N/A   Social History Main Topics  . Smoking status: Former Research scientist (life sciences)  . Smokeless tobacco: Never Used  . Alcohol use Yes     Comment: socially  . Drug use: No  . Sexual activity: Not on file   Other Topics Concern  . Not on file   Social History Narrative  . No narrative on file     Family History:  The patient's ***family history includes CVA in her mother; Coronary artery disease in her father; Diabetes in her brother, father, and mother.      *** ROS/PE    Wt Readings from Last 3  Encounters:  02/01/16 132 lb 9.6 oz (60.1 kg)  04/23/09 128 lb (58.1 kg)      Studies/Labs Reviewed:   EKG:  EKG is*** ordered today.  The ekg ordered today demonstrates ***  Recent Labs: No results found for requested labs within last 8760 hours.   Lipid Panel No results found for: CHOL, TRIG, HDL, CHOLHDL, VLDL, LDLCALC, LDLDIRECT  Additional studies/ records that were reviewed today include:  2D ECHO: 02/28/2016 Study Conclusions - Left ventricle: The cavity size was normal. Systolic function was   normal. Wall motion was normal; there were no regional wall   motion abnormalities. Doppler parameters are consistent with   abnormal left ventricular relaxation (grade 1 diastolic   dysfunction). Doppler parameters are consistent with    indeterminate ventricular filling pressure. - Mitral valve: Transvalvular velocity was within the normal range.   There was no evidence for stenosis. There was trivial   regurgitation. - Right ventricle: The cavity size was normal. Wall thickness was   normal. Systolic function was normal. - Atrial septum: No defect or patent foramen ovale was identified   by color flow Doppler. - Tricuspid valve: There was mild regurgitation. - Pulmonary arteries: Systolic pressure was within the normal   range. PA peak pressure: 24 mm Hg (S).  02/28/16 Event monitor    Basic underlying rhythm is normal sinus rhythm.  Symptoms of palpitations and chest pain corresponded with heart rates in the 140-155 bpm range and appear to represent sinus tachycardia or atrial tachycardia  No atrial fibrillation is identified         ASSESSMENT & PLAN:   SVT/atrial tach:      Medication Adjustments/Labs and Tests Ordered: Current medicines are reviewed at length with the patient today.  Concerns regarding medicines are outlined above.  Medication changes, Labs and Tests ordered today are listed in the Patient Instructions below. There are no Patient Instructions on file for this visit.   Signed, Phillipa Mellish, PA-C  05/20/2016 8:45 PM    [facility] [place], [place], [place]  [address] Phone: [phone]; Fax: [phone]

## 2016-05-23 ENCOUNTER — Ambulatory Visit: Payer: Medicare Other | Admitting: Physician Assistant

## 2016-05-30 ENCOUNTER — Encounter: Payer: Self-pay | Admitting: Physician Assistant

## 2016-06-26 NOTE — Progress Notes (Addendum)
Cardiology Office Note    Date:  06/28/2016   ID:  [name], [name] [birth year], MRN [id number]  PCP:  London Pepper, MD  Cardiologist:  Dr. Tamala Julian   CC: follow up atrial tach  History of Present Illness:  Kayla Lynch is a 70 y.o. female with a history of SVT, symptomatic PAT and migraine headaches who presents to clinic for follow up.   Holter monitor ordered by PCP in 11/2015 showed short runs of SVT. She was seen by Dr. Tamala Julian on 02/01/16 for evaluation of palpitations associated with chest tightness and dyspnea. A 30 day event monitor was ordered which showed occasional episodes of rapid heart rate into the 140-150 range that correlated with her clinical complaints. Felt to be sinus tach or atrial tach with no evidence of afib. She was started metoprolol succinate 25 mg per day. 2D ECHO showed normal structure and function of heart. She has s history of a previous stress test at [facility] which was normal. Suggusestion of possible calcium score was reommended.  Today she presents to clinic for follow up. Palpitations have been better since taking the Toprol XL but still having some episodes mostly at night. Her HR was ~ 120 bpm. One episode lasted about an hour and it was pretty uncomfortable. She does have chest pressure with palpitations. She walks a lot and sometimes gets some chest pressure that is relieved with rest. Sometimes it radiates into her neck. No associated SOB or diaphoresis. No dyspnea on exertion. No LE edema, orthopnea or pND. No dizziness or syncope. No blood in stool or urine.   Her dad had two bypass surgeries, first in his 75s.   Past Medical History:  Diagnosis Date  . AMEBIASIS 03/01/2009   Qualifier: History of  By: Nelson-Smith CMA (AAMA), Dottie    . CHICKENPOX, HX OF 03/01/2009   Qualifier: Diagnosis of  By: Nelson-Smith CMA (AAMA), Dottie    . CONSTIPATION 03/01/2009   Qualifier: Diagnosis of  By: Nelson-Smith CMA (AAMA), Dottie    . Diarrhea  03/01/2009   Qualifier: Diagnosis of  By: Nelson-Smith CMA (AAMA), Dottie    . 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 INTERNAL HEMORRHOIDS 03/01/2009   Qualifier: Diagnosis of  By: Nelson-Smith CMA (AAMA), Dottie    . MIGRAINE HEADACHE 03/01/2009   Qualifier: Diagnosis of  By: Nelson-Smith CMA (AAMA), Dottie    . Palpitations 10/26/2015  . Personal history of other infectious and parasitic disease 03/01/2009   Centricity Description: MUMPS, HX OF Qualifier: Diagnosis of  By: Nelson-Smith CMA (AAMA), Dottie   Centricity Description: MEASLES, HX OF Qualifier: Diagnosis of  By: Nelson-Smith CMA (AAMA), Dottie      Past Surgical History:  Procedure Laterality Date  . NO PAST SURGERIES      Current Medications: Outpatient Medications Prior to Visit  Medication Sig Dispense Refill  . aspirin EC 81 MG tablet Take 1 tablet (81 mg total) by mouth daily.    Marland Kitchen estradiol (CLIMARA - DOSED IN MG/24 HR) 0.075 mg/24hr Place 1 patch onto the skin 2 (two) times a week.     . metoprolol succinate (TOPROL-XL) 25 MG 24 hr tablet Take 1 tablet (25 mg total) by mouth daily. 90 tablet 3  . progesterone (PROMETRIUM) 100 MG capsule Take 100 mg by mouth daily.     No facility-administered medications prior to visit.      Allergies:   Clindamycin/lincomycin   Social  History   Social History  . Marital status: Single    Spouse name: N/A  . Number of children: N/A  . Years of education: N/A   Social History Main Topics  . Smoking status: Former Research scientist (life sciences)  . Smokeless tobacco: Never Used  . Alcohol use Yes     Comment: socially  . Drug use: No  . Sexual activity: Not Asked   Other Topics Concern  . None   Social History Narrative  . None     Family History:  The patient's family history includes CVA in her mother; Coronary artery disease in her father; Diabetes in her brother, father, and mother.      ROS:   Please see the history of present illness.     ROS All other systems reviewed and are negative.   PHYSICAL EXAM:   VS:  BP 130/82   Pulse 74   Ht 5\' 3"  (1.6 m)   Wt 131 lb 12.8 oz (59.8 kg)   SpO2 98%   BMI 23.35 kg/m    GEN: Well nourished, well developed, in no acute distress  HEENT: normal  Neck: no JVD, carotid bruits, or masses Cardiac: RRR; no murmurs, rubs, or gallops,no edema  Respiratory:  clear to auscultation bilaterally, normal work of breathing GI: soft, nontender, nondistended, + BS MS: no deformity or atrophy  Skin: warm and dry, no rash Neuro:  Alert and Oriented x 3, Strength and sensation are intact Psych: euthymic mood, full affect   Wt Readings from Last 3 Encounters:  06/28/16 131 lb 12.8 oz (59.8 kg)  02/01/16 132 lb 9.6 oz (60.1 kg)  04/23/09 128 lb (58.1 kg)     Studies/Labs Reviewed:   EKG:  EKG is ordered today.  The ekg ordered today demonstrates NSR HR 70  Recent Labs: No results found for requested labs within last 8760 hours.   Lipid Panel No results found for: CHOL, TRIG, HDL, CHOLHDL, VLDL, LDLCALC, LDLDIRECT  Additional studies/ records that were reviewed today include:  2D ECHO: 02/28/2016 Study Conclusions - Left ventricle: The cavity size was normal. Systolic function was   normal. Wall motion was normal; there were no regional wall   motion abnormalities. Doppler parameters are consistent with   abnormal left ventricular relaxation (grade 1 diastolic   dysfunction). Doppler parameters are consistent with   indeterminate ventricular filling pressure. - Mitral valve: Transvalvular velocity was within the normal range.   There was no evidence for stenosis. There was trivial   regurgitation. - Right ventricle: The cavity size was normal. Wall thickness was   normal. Systolic function was normal. - Atrial septum: No defect or patent foramen ovale was identified   by color flow Doppler. - Tricuspid valve: There was mild regurgitation. - Pulmonary arteries: Systolic  pressure was within the normal   range. PA peak pressure: 24 mm Hg (S).    ASSESSMENT & PLAN:   Sinus tach/ PAT: better on the Toprol XL 25mg  daily. Has had some more episodes so will give her PRN lopressor 25mg .   Chest pressure: with history of negative nuclear stress test. Will set her up for a cardiac CT with calcium scoring. I have asked her to take an extra Lopressor 25mg  on the day of her procedure.   Migraine HAs: stable.    Medication Adjustments/Labs and Tests Ordered: Current medicines are reviewed at length with the patient today.  Concerns regarding medicines are outlined above.  Medication changes, Labs and Tests ordered today are  listed in the Patient Instructions below. Patient Instructions  Medication Instructions:  Your physician has recommended you make the following change in your medication:  1.  START Lopressor 25 mg taking 1 tablet daily AS NEEDED for palpitations   Labwork: TODAY:  BMET  Testing/Procedures: Your physician has requested that you have cardiac CT. (TAKE A LOPRESSOR 1 HOUR BEFORE YOUR CT) Cardiac computed tomography (CT) is a painless test that uses an x-ray machine to take clear, detailed pictures of your heart. For further information please visit HugeFiesta[employer][email or web address]. Please follow instruction sheet as given.   Follow-Up: Your physician recommends that you schedule a follow-up appointment in: [place]   Any Other Special Instructions Will Be Listed Below (If Applicable).  Cardiac CT Angiogram A cardiac CT angiogram is a test to help your health care provider find out why you are having chest pains or other symptoms of heart disease. The test uses an advanced type of X-ray machine that scans your heart and the area around the heart and creates multiple pictures of it. Other names for the test are coronary CT angiography, coronary artery scanning, and CTA.  The test is painless and fairly quick. It is noninvasive. That means it  does not involve any type of surgery or cuts (incisions). Instead, a fluid called contrast dye is injected into an IV tube in your arm. The contrast dye acts as a highlighter as it flows through the veins. With the CT scan, it lets your health care provider see:   If the coronary arteries in your heart are more narrow than they should be, or if they are blocked.  If there is fluid around the heart.  If the muscles and tissues of the heart look weak or show signs of disease.  If the lungs contain any blood clots. LET [facility] CARE PROVIDER KNOW ABOUT:  Any allergies you have.   All medicines you are taking, including vitamins, herbs, eye drops, creams, and over-the-counter medicines.  Previous problems you or members of your family have had with the use of anesthetics.  Any blood disorders you have.  Previous surgeries you have had.  Medical conditions you have. RISKS AND COMPLICATIONS Generally, this is a safe procedure. However, as with any procedure, problems can occur. Possible problems include:   Allergic reaction to the contrast dye. This can range from mild to severe and may include:   Itching at the IV tube insertion site.   Redness at the IV tube insertion site.   Hives.   Nausea.   Difficulty breathing.   Kidney failure.   Problems from radiation exposure. This test involves the use of radiation. Radiation exposure can be dangerous to a pregnant patient and fetus. If you are pregnant, shields are used to protect your belly and pelvic area. More details are available from your health care provider. BEFORE THE PROCEDURE  The day before the test:    Stop drinking caffeinated beverages. These include energy drinks, tea, soda, coffee, and hot chocolate.  Stop taking medicines to treat erectile dysfunction. They can interfere with medicines you may be given during the procedure. Check with your health care provider if you should stop taking any other  medicines. On the day of the test:   About 4 hours before the test, stop eating and drinking anything but water as advised by your health care provider.  Avoid wearing jewelry. You will have to undress from the waist up and wear a hospital gown. PROCEDURE  The hair on your chest may need to be shaved. This is done because small sticky patches called electrodes are put on your chest. These transmit information that helps monitor your heart during the test.  You might be given heart medicine during the test. This is done to control your heart rate during the test so a good image is obtained.  An IV tube will be inserted in your arm.  You will be asked to lie on a table with your arms above your head.  The contrast dye will be injected into the IV tube. You might feel warm or you may get a metallic taste in your mouth.  The table you are lying on will move into a large machine that will do the scanning.  You will be able to see, hear, and talk to the person running the machine while you are in it. Follow that person's directions. You may be asked to hold your breath for 2-3 seconds as pictures are taken.  The CT machine will move around you to take pictures. Do not move while it is scanning. This helps to get a good image of your heart.  When the best possible pictures have been taken, the machine will be turned off. The table will move out of the machine. The IV tube will then be removed. AFTER THE PROCEDURE  You will be allowed to get dressed and return to your normal activities.  Results will be interpreted by the health care provider and the results will be discussed with you. This information is not intended to replace advice given to you by your health care provider. Make sure you discuss any questions you have with your health care provider. Document Released: 04/06/2008 Document Revised: 05/15/2014 Document Reviewed: 01/08/2013 Elsevier Interactive Patient Education  2017  [employer].     If you need a refill on your cardiac medications before your next appointment, please call your pharmacy.      Signed, Catlin Mckiver, PA-C  06/28/2016 [facility], [place], [place]  [address] Phone: [phone]; Fax: [phone]

## 2016-06-28 ENCOUNTER — Encounter: Payer: Self-pay | Admitting: Physician Assistant

## 2016-06-28 ENCOUNTER — Ambulatory Visit (INDEPENDENT_AMBULATORY_CARE_PROVIDER_SITE_OTHER): Payer: Medicare Other | Admitting: Physician Assistant

## 2016-06-28 VITALS — BP 130/82 | HR 74 | Ht 63.0 in | Wt 131.8 lb

## 2016-06-28 DIAGNOSIS — R079 Chest pain, unspecified: Secondary | ICD-10-CM

## 2016-06-28 DIAGNOSIS — Z136 Encounter for screening for cardiovascular disorders: Secondary | ICD-10-CM

## 2016-06-28 DIAGNOSIS — R0789 Other chest pain: Secondary | ICD-10-CM

## 2016-06-28 DIAGNOSIS — IMO0001 Reserved for inherently not codable concepts without codable children: Secondary | ICD-10-CM

## 2016-06-28 DIAGNOSIS — I471 Supraventricular tachycardia: Secondary | ICD-10-CM

## 2016-06-28 DIAGNOSIS — R002 Palpitations: Secondary | ICD-10-CM | POA: Diagnosis not present

## 2016-06-28 MED ORDER — METOPROLOL TARTRATE 25 MG PO TABS: ORAL_TABLET | ORAL | 1 refills | Status: AC

## 2016-06-28 NOTE — Patient Instructions (Addendum)
Medication Instructions:  Your physician has recommended you make the following change in your medication:  1.  START Lopressor 25 mg taking 1 tablet daily AS NEEDED for palpitations   Labwork: TODAY:  BMET  Testing/Procedures: Your physician has requested that you have cardiac CT. (TAKE A LOPRESSOR 1 HOUR BEFORE YOUR CT) Cardiac computed tomography (CT) is a painless test that uses an x-ray machine to take clear, detailed pictures of your heart. For further information please visit HugeFiesta[employer][email or web address]. Please follow instruction sheet as given.   Follow-Up: Your physician recommends that you schedule a follow-up appointment in: [place]   Any Other Special Instructions Will Be Listed Below (If Applicable).  Cardiac CT Angiogram A cardiac CT angiogram is a test to help your health care provider find out why you are having chest pains or other symptoms of heart disease. The test uses an advanced type of X-ray machine that scans your heart and the area around the heart and creates multiple pictures of it. Other names for the test are coronary CT angiography, coronary artery scanning, and CTA.  The test is painless and fairly quick. It is noninvasive. That means it does not involve any type of surgery or cuts (incisions). Instead, a fluid called contrast dye is injected into an IV tube in your arm. The contrast dye acts as a highlighter as it flows through the veins. With the CT scan, it lets your health care provider see:   If the coronary arteries in your heart are more narrow than they should be, or if they are blocked.  If there is fluid around the heart.  If the muscles and tissues of the heart look weak or show signs of disease.  If the lungs contain any blood clots. LET [facility] CARE PROVIDER KNOW ABOUT:  Any allergies you have.   All medicines you are taking, including vitamins, herbs, eye drops, creams, and over-the-counter medicines.  Previous problems you  or members of your family have had with the use of anesthetics.  Any blood disorders you have.  Previous surgeries you have had.  Medical conditions you have. RISKS AND COMPLICATIONS Generally, this is a safe procedure. However, as with any procedure, problems can occur. Possible problems include:   Allergic reaction to the contrast dye. This can range from mild to severe and may include:   Itching at the IV tube insertion site.   Redness at the IV tube insertion site.   Hives.   Nausea.   Difficulty breathing.   Kidney failure.   Problems from radiation exposure. This test involves the use of radiation. Radiation exposure can be dangerous to a pregnant patient and fetus. If you are pregnant, shields are used to protect your belly and pelvic area. More details are available from your health care provider. BEFORE THE PROCEDURE  The day before the test:    Stop drinking caffeinated beverages. These include energy drinks, tea, soda, coffee, and hot chocolate.  Stop taking medicines to treat erectile dysfunction. They can interfere with medicines you may be given during the procedure. Check with your health care provider if you should stop taking any other medicines. On the day of the test:   About 4 hours before the test, stop eating and drinking anything but water as advised by your health care provider.  Avoid wearing jewelry. You will have to undress from the waist up and wear a hospital gown. PROCEDURE  The hair on your chest may need to be  shaved. This is done because small sticky patches called electrodes are put on your chest. These transmit information that helps monitor your heart during the test.  You might be given heart medicine during the test. This is done to control your heart rate during the test so a good image is obtained.  An IV tube will be inserted in your arm.  You will be asked to lie on a table with your arms above your head.  The contrast  dye will be injected into the IV tube. You might feel warm or you may get a metallic taste in your mouth.  The table you are lying on will move into a large machine that will do the scanning.  You will be able to see, hear, and talk to the person running the machine while you are in it. Follow that person's directions. You may be asked to hold your breath for 2-3 seconds as pictures are taken.  The CT machine will move around you to take pictures. Do not move while it is scanning. This helps to get a good image of your heart.  When the best possible pictures have been taken, the machine will be turned off. The table will move out of the machine. The IV tube will then be removed. AFTER THE PROCEDURE  You will be allowed to get dressed and return to your normal activities.  Results will be interpreted by the health care provider and the results will be discussed with you. This information is not intended to replace advice given to you by your health care provider. Make sure you discuss any questions you have with your health care provider. Document Released: 04/06/2008 Document Revised: 05/15/2014 Document Reviewed: 01/08/2013 Elsevier Interactive Patient Education  2017 [employer].     If you need a refill on your cardiac medications before your next appointment, please call your pharmacy.

## 2016-06-29 LAB — BASIC METABOLIC PANEL
BUN / CREAT RATIO: 17 (ref 12–28)
BUN: 10 mg/dL (ref 8–27)
CALCIUM: 9.5 mg/dL (ref 8.7–10.3)
CHLORIDE: 98 mmol/L (ref 96–106)
CO2: 24 mmol/L (ref 18–29)
Creatinine, Ser: 0.58 mg/dL (ref 0.57–1.00)
GFR calc non Af Amer: 95 (ref >59)
GFR, EST AFRICAN AMERICAN: 110 (ref >59)
Glucose: 93 mg/dL (ref 65–99)
Potassium: 4 mmol/L (ref 3.5–5.2)
Sodium: 137 mmol/L (ref 134–144)

## 2016-07-06 NOTE — Addendum Note (Signed)
Addended by: Gaetano Net on: 07/06/2016 10:06 AM   Modules accepted: Orders

## 2016-07-11 ENCOUNTER — Encounter: Payer: Self-pay | Admitting: Interventional Cardiology

## 2016-07-13 ENCOUNTER — Ambulatory Visit (HOSPITAL_COMMUNITY): Payer: Medicare Other

## 2016-07-20 ENCOUNTER — Encounter (HOSPITAL_COMMUNITY): Payer: Self-pay

## 2016-07-20 ENCOUNTER — Ambulatory Visit (HOSPITAL_COMMUNITY)
Admission: RE | Admit: 2016-07-20 | Discharge: 2016-07-20 | Disposition: A | Discharge status: Home / Self Care | Payer: Medicare Other | Source: Ambulatory Visit | Attending: Physician Assistant | Admitting: Physician Assistant

## 2016-07-20 DIAGNOSIS — R002 Palpitations: Secondary | ICD-10-CM

## 2016-07-20 DIAGNOSIS — R0789 Other chest pain: Secondary | ICD-10-CM | POA: Insufficient documentation

## 2016-07-20 DIAGNOSIS — R079 Chest pain, unspecified: Secondary | ICD-10-CM

## 2016-07-20 DIAGNOSIS — Z833 Family history of diabetes mellitus: Secondary | ICD-10-CM | POA: Insufficient documentation

## 2016-07-20 DIAGNOSIS — Z823 Family history of stroke: Secondary | ICD-10-CM | POA: Insufficient documentation

## 2016-07-20 DIAGNOSIS — I471 Supraventricular tachycardia: Secondary | ICD-10-CM | POA: Insufficient documentation

## 2016-07-20 DIAGNOSIS — IMO0001 Reserved for inherently not codable concepts without codable children: Secondary | ICD-10-CM

## 2016-07-20 DIAGNOSIS — Z87891 Personal history of nicotine dependence: Secondary | ICD-10-CM | POA: Insufficient documentation

## 2016-07-20 DIAGNOSIS — Z8249 Family history of ischemic heart disease and other diseases of the circulatory system: Secondary | ICD-10-CM | POA: Insufficient documentation

## 2016-07-20 MED ORDER — METOPROLOL TARTRATE 5 MG/5ML IV SOLN
5.0000 mg | INTRAVENOUS | Status: DC | PRN
  Administered 2016-07-20: 5 mg via INTRAVENOUS
  Filled 2016-07-20: qty 5

## 2016-07-20 MED ORDER — NITROGLYCERIN 0.4 MG SL SUBL
0.8000 mg | SUBLINGUAL_TABLET | Freq: Once | SUBLINGUAL | Status: AC
  Administered 2016-07-20: 0.8 mg via SUBLINGUAL
  Filled 2016-07-20: qty 25

## 2016-07-20 MED ORDER — IOPAMIDOL (ISOVUE-370) INJECTION 76%
INTRAVENOUS | Status: AC
  Administered 2016-07-20: 80 mL
  Filled 2016-07-20: qty 100

## 2016-07-20 MED ORDER — NITROGLYCERIN 0.4 MG SL SUBL
SUBLINGUAL_TABLET | SUBLINGUAL | Status: AC
  Filled 2016-07-20: qty 2

## 2016-07-20 MED ORDER — METOPROLOL TARTRATE 5 MG/5ML IV SOLN
INTRAVENOUS | Status: AC
  Filled 2016-07-20: qty 15

## 2016-07-20 NOTE — Progress Notes (Signed)
CT sacan completed. Tolerated well. D/C home walking by herself. Awake and alert. In no distress.

## 2016-11-01 ENCOUNTER — Ambulatory Visit: Payer: Medicare Other | Admitting: Interventional Cardiology

## 2017-01-10 DIAGNOSIS — B009 Herpesviral infection, unspecified: Secondary | ICD-10-CM | POA: Diagnosis not present

## 2017-01-10 DIAGNOSIS — J3489 Other specified disorders of nose and nasal sinuses: Secondary | ICD-10-CM | POA: Diagnosis not present

## 2017-01-10 DIAGNOSIS — Z23 Encounter for immunization: Secondary | ICD-10-CM | POA: Diagnosis not present

## 2017-02-22 DIAGNOSIS — R55 Syncope and collapse: Secondary | ICD-10-CM | POA: Diagnosis not present

## 2017-02-22 DIAGNOSIS — J069 Acute upper respiratory infection, unspecified: Secondary | ICD-10-CM | POA: Diagnosis not present

## 2017-02-22 DIAGNOSIS — R42 Dizziness and giddiness: Secondary | ICD-10-CM | POA: Diagnosis not present

## 2017-04-24 DIAGNOSIS — L301 Dyshidrosis [pompholyx]: Secondary | ICD-10-CM | POA: Diagnosis not present

## 2017-06-06 ENCOUNTER — Other Ambulatory Visit: Payer: Self-pay | Admitting: Family Medicine

## 2017-06-06 DIAGNOSIS — Z139 Encounter for screening, unspecified: Secondary | ICD-10-CM

## 2017-06-26 ENCOUNTER — Ambulatory Visit
Admission: RE | Admit: 2017-06-26 | Discharge: 2017-06-26 | Disposition: A | Discharge status: Home / Self Care | Payer: Medicare Other | Source: Ambulatory Visit | Attending: Family Medicine | Admitting: Family Medicine

## 2017-06-26 DIAGNOSIS — Z139 Encounter for screening, unspecified: Secondary | ICD-10-CM

## 2017-06-26 DIAGNOSIS — Z1231 Encounter for screening mammogram for malignant neoplasm of breast: Secondary | ICD-10-CM | POA: Diagnosis not present

## 2017-10-29 DIAGNOSIS — L820 Inflamed seborrheic keratosis: Secondary | ICD-10-CM | POA: Diagnosis not present

## 2018-02-08 DIAGNOSIS — L820 Inflamed seborrheic keratosis: Secondary | ICD-10-CM | POA: Diagnosis not present

## 2018-03-05 DIAGNOSIS — L308 Other specified dermatitis: Secondary | ICD-10-CM | POA: Diagnosis not present

## 2018-05-14 DIAGNOSIS — L820 Inflamed seborrheic keratosis: Secondary | ICD-10-CM | POA: Diagnosis not present

## 2018-07-17 ENCOUNTER — Other Ambulatory Visit: Payer: Self-pay | Admitting: Family Medicine

## 2018-07-22 DIAGNOSIS — Z1239 Encounter for other screening for malignant neoplasm of breast: Secondary | ICD-10-CM | POA: Diagnosis not present

## 2018-07-23 ENCOUNTER — Other Ambulatory Visit: Payer: Self-pay | Admitting: Family Medicine

## 2018-07-23 DIAGNOSIS — Z1231 Encounter for screening mammogram for malignant neoplasm of breast: Secondary | ICD-10-CM

## 2018-08-19 DIAGNOSIS — R109 Unspecified abdominal pain: Secondary | ICD-10-CM | POA: Diagnosis not present

## 2018-08-19 DIAGNOSIS — K5900 Constipation, unspecified: Secondary | ICD-10-CM | POA: Diagnosis not present

## 2018-08-26 ENCOUNTER — Ambulatory Visit: Payer: Medicare Other

## 2018-09-12 DIAGNOSIS — L821 Other seborrheic keratosis: Secondary | ICD-10-CM | POA: Diagnosis not present

## 2018-09-12 DIAGNOSIS — L814 Other melanin hyperpigmentation: Secondary | ICD-10-CM | POA: Diagnosis not present

## 2018-09-12 DIAGNOSIS — D225 Melanocytic nevi of trunk: Secondary | ICD-10-CM | POA: Diagnosis not present

## 2018-09-12 DIAGNOSIS — L820 Inflamed seborrheic keratosis: Secondary | ICD-10-CM | POA: Diagnosis not present

## 2018-09-12 DIAGNOSIS — D1801 Hemangioma of skin and subcutaneous tissue: Secondary | ICD-10-CM | POA: Diagnosis not present

## 2018-09-12 DIAGNOSIS — D485 Neoplasm of uncertain behavior of skin: Secondary | ICD-10-CM | POA: Diagnosis not present

## 2018-10-04 ENCOUNTER — Other Ambulatory Visit: Payer: Medicare Other

## 2018-10-04 ENCOUNTER — Telehealth: Payer: Self-pay

## 2018-10-04 DIAGNOSIS — R6889 Other general symptoms and signs: Secondary | ICD-10-CM | POA: Diagnosis not present

## 2018-10-04 DIAGNOSIS — Z20822 Contact with and (suspected) exposure to covid-19: Secondary | ICD-10-CM

## 2018-10-04 DIAGNOSIS — J029 Acute pharyngitis, unspecified: Secondary | ICD-10-CM | POA: Diagnosis not present

## 2018-10-04 NOTE — Telephone Encounter (Signed)
Contacted to schedule patient for COVID-19 testing by [facility]  [place] for Dr Jonathon Jordan. Call placed to patient and appointment scheduled. Pt is aware of address and will wear mask. Order placed.

## 2018-10-06 LAB — NOVEL CORONAVIRUS, NAA: SARS-CoV-2, NAA: NOT DETECTED

## 2018-10-11 ENCOUNTER — Ambulatory Visit: Payer: Medicare Other

## 2018-11-20 DIAGNOSIS — R109 Unspecified abdominal pain: Secondary | ICD-10-CM | POA: Diagnosis not present

## 2018-11-20 DIAGNOSIS — Z1211 Encounter for screening for malignant neoplasm of colon: Secondary | ICD-10-CM | POA: Diagnosis not present

## 2018-11-20 DIAGNOSIS — K5900 Constipation, unspecified: Secondary | ICD-10-CM | POA: Diagnosis not present

## 2018-11-25 ENCOUNTER — Ambulatory Visit: Payer: Medicare Other

## 2018-11-29 ENCOUNTER — Ambulatory Visit
Admission: RE | Admit: 2018-11-29 | Discharge: 2018-11-29 | Disposition: A | Discharge status: Home / Self Care | Payer: Medicare Other | Source: Ambulatory Visit | Attending: Family Medicine | Admitting: Family Medicine

## 2018-11-29 ENCOUNTER — Other Ambulatory Visit: Payer: Self-pay

## 2018-11-29 DIAGNOSIS — Z1231 Encounter for screening mammogram for malignant neoplasm of breast: Secondary | ICD-10-CM | POA: Diagnosis not present

## 2018-12-05 DIAGNOSIS — L820 Inflamed seborrheic keratosis: Secondary | ICD-10-CM | POA: Diagnosis not present

## 2018-12-05 DIAGNOSIS — L821 Other seborrheic keratosis: Secondary | ICD-10-CM | POA: Diagnosis not present

## 2018-12-05 DIAGNOSIS — D485 Neoplasm of uncertain behavior of skin: Secondary | ICD-10-CM | POA: Diagnosis not present

## 2018-12-16 DIAGNOSIS — Z20828 Contact with and (suspected) exposure to other viral communicable diseases: Secondary | ICD-10-CM | POA: Diagnosis not present

## 2018-12-17 DIAGNOSIS — R05 Cough: Secondary | ICD-10-CM | POA: Diagnosis not present

## 2018-12-19 DIAGNOSIS — U071 COVID-19: Secondary | ICD-10-CM | POA: Diagnosis not present

## 2018-12-26 DIAGNOSIS — U071 COVID-19: Secondary | ICD-10-CM | POA: Diagnosis not present

## 2018-12-31 DIAGNOSIS — Z20828 Contact with and (suspected) exposure to other viral communicable diseases: Secondary | ICD-10-CM | POA: Diagnosis not present

## 2019-01-27 DIAGNOSIS — Z03818 Encounter for observation for suspected exposure to other biological agents ruled out: Secondary | ICD-10-CM | POA: Diagnosis not present

## 2019-01-27 DIAGNOSIS — Z1159 Encounter for screening for other viral diseases: Secondary | ICD-10-CM | POA: Diagnosis not present

## 2019-02-03 DIAGNOSIS — Z0000 Encounter for general adult medical examination without abnormal findings: Secondary | ICD-10-CM | POA: Diagnosis not present

## 2019-02-12 DIAGNOSIS — D2239 Melanocytic nevi of other parts of face: Secondary | ICD-10-CM | POA: Diagnosis not present

## 2019-02-12 DIAGNOSIS — D485 Neoplasm of uncertain behavior of skin: Secondary | ICD-10-CM | POA: Diagnosis not present

## 2019-04-17 DIAGNOSIS — L820 Inflamed seborrheic keratosis: Secondary | ICD-10-CM | POA: Diagnosis not present

## 2019-04-17 DIAGNOSIS — R208 Other disturbances of skin sensation: Secondary | ICD-10-CM | POA: Diagnosis not present

## 2019-05-20 DIAGNOSIS — J069 Acute upper respiratory infection, unspecified: Secondary | ICD-10-CM | POA: Diagnosis not present

## 2019-05-20 DIAGNOSIS — J0100 Acute maxillary sinusitis, unspecified: Secondary | ICD-10-CM | POA: Diagnosis not present

## 2019-05-29 DIAGNOSIS — L309 Dermatitis, unspecified: Secondary | ICD-10-CM | POA: Diagnosis not present

## 2019-05-29 DIAGNOSIS — K130 Diseases of lips: Secondary | ICD-10-CM | POA: Diagnosis not present

## 2019-06-06 DIAGNOSIS — F17210 Nicotine dependence, cigarettes, uncomplicated: Secondary | ICD-10-CM | POA: Diagnosis not present

## 2019-06-06 DIAGNOSIS — G43909 Migraine, unspecified, not intractable, without status migrainosus: Secondary | ICD-10-CM | POA: Diagnosis not present

## 2019-06-06 DIAGNOSIS — R079 Chest pain, unspecified: Secondary | ICD-10-CM | POA: Diagnosis not present

## 2019-06-06 DIAGNOSIS — Z8616 Personal history of COVID-19: Secondary | ICD-10-CM | POA: Diagnosis not present

## 2019-06-06 DIAGNOSIS — R200 Anesthesia of skin: Secondary | ICD-10-CM | POA: Diagnosis not present

## 2019-06-06 DIAGNOSIS — R0789 Other chest pain: Secondary | ICD-10-CM | POA: Diagnosis not present

## 2019-06-07 DIAGNOSIS — R079 Chest pain, unspecified: Secondary | ICD-10-CM | POA: Diagnosis not present

## 2019-06-19 DIAGNOSIS — I471 Supraventricular tachycardia: Secondary | ICD-10-CM | POA: Diagnosis not present

## 2019-06-19 DIAGNOSIS — K219 Gastro-esophageal reflux disease without esophagitis: Secondary | ICD-10-CM | POA: Diagnosis not present

## 2019-06-19 DIAGNOSIS — Z87891 Personal history of nicotine dependence: Secondary | ICD-10-CM | POA: Diagnosis not present

## 2019-06-19 DIAGNOSIS — R002 Palpitations: Secondary | ICD-10-CM | POA: Diagnosis not present

## 2019-06-19 DIAGNOSIS — Z8249 Family history of ischemic heart disease and other diseases of the circulatory system: Secondary | ICD-10-CM | POA: Diagnosis not present

## 2019-06-19 DIAGNOSIS — R0789 Other chest pain: Secondary | ICD-10-CM | POA: Diagnosis not present

## 2019-06-19 DIAGNOSIS — R079 Chest pain, unspecified: Secondary | ICD-10-CM | POA: Diagnosis not present

## 2019-07-11 DIAGNOSIS — R002 Palpitations: Secondary | ICD-10-CM | POA: Diagnosis not present

## 2019-07-12 DIAGNOSIS — R002 Palpitations: Secondary | ICD-10-CM | POA: Diagnosis not present

## 2019-07-22 DIAGNOSIS — R208 Other disturbances of skin sensation: Secondary | ICD-10-CM | POA: Diagnosis not present

## 2019-07-22 DIAGNOSIS — K130 Diseases of lips: Secondary | ICD-10-CM | POA: Diagnosis not present

## 2019-07-22 DIAGNOSIS — L820 Inflamed seborrheic keratosis: Secondary | ICD-10-CM | POA: Diagnosis not present

## 2019-08-19 DIAGNOSIS — R208 Other disturbances of skin sensation: Secondary | ICD-10-CM | POA: Diagnosis not present

## 2019-08-19 DIAGNOSIS — K130 Diseases of lips: Secondary | ICD-10-CM | POA: Diagnosis not present

## 2019-08-19 DIAGNOSIS — L820 Inflamed seborrheic keratosis: Secondary | ICD-10-CM | POA: Diagnosis not present

## 2019-08-19 DIAGNOSIS — D225 Melanocytic nevi of trunk: Secondary | ICD-10-CM | POA: Diagnosis not present

## 2019-10-16 DIAGNOSIS — L820 Inflamed seborrheic keratosis: Secondary | ICD-10-CM | POA: Diagnosis not present

## 2019-12-12 DIAGNOSIS — R891 Abnormal level of hormones in specimens from other organs, systems and tissues: Secondary | ICD-10-CM | POA: Diagnosis not present

## 2019-12-12 DIAGNOSIS — Z13820 Encounter for screening for osteoporosis: Secondary | ICD-10-CM | POA: Diagnosis not present

## 2019-12-12 DIAGNOSIS — E611 Iron deficiency: Secondary | ICD-10-CM | POA: Diagnosis not present

## 2019-12-12 DIAGNOSIS — E569 Vitamin deficiency, unspecified: Secondary | ICD-10-CM | POA: Diagnosis not present

## 2019-12-12 DIAGNOSIS — E59 Dietary selenium deficiency: Secondary | ICD-10-CM | POA: Diagnosis not present

## 2019-12-12 DIAGNOSIS — E7800 Pure hypercholesterolemia, unspecified: Secondary | ICD-10-CM | POA: Diagnosis not present

## 2019-12-12 DIAGNOSIS — Z1239 Encounter for other screening for malignant neoplasm of breast: Secondary | ICD-10-CM | POA: Diagnosis not present

## 2019-12-12 DIAGNOSIS — E7211 Homocystinuria: Secondary | ICD-10-CM | POA: Diagnosis not present

## 2019-12-12 DIAGNOSIS — E063 Autoimmune thyroiditis: Secondary | ICD-10-CM | POA: Diagnosis not present

## 2019-12-12 DIAGNOSIS — R7303 Prediabetes: Secondary | ICD-10-CM | POA: Diagnosis not present

## 2019-12-12 DIAGNOSIS — R7982 Elevated C-reactive protein (CRP): Secondary | ICD-10-CM | POA: Diagnosis not present

## 2019-12-12 DIAGNOSIS — E559 Vitamin D deficiency, unspecified: Secondary | ICD-10-CM | POA: Diagnosis not present

## 2019-12-17 DIAGNOSIS — E063 Autoimmune thyroiditis: Secondary | ICD-10-CM | POA: Diagnosis not present

## 2019-12-17 DIAGNOSIS — R891 Abnormal level of hormones in specimens from other organs, systems and tissues: Secondary | ICD-10-CM | POA: Diagnosis not present

## 2019-12-17 DIAGNOSIS — E611 Iron deficiency: Secondary | ICD-10-CM | POA: Diagnosis not present

## 2019-12-17 DIAGNOSIS — E559 Vitamin D deficiency, unspecified: Secondary | ICD-10-CM | POA: Diagnosis not present

## 2019-12-17 DIAGNOSIS — E7211 Homocystinuria: Secondary | ICD-10-CM | POA: Diagnosis not present

## 2019-12-17 DIAGNOSIS — E569 Vitamin deficiency, unspecified: Secondary | ICD-10-CM | POA: Diagnosis not present

## 2019-12-17 DIAGNOSIS — E7800 Pure hypercholesterolemia, unspecified: Secondary | ICD-10-CM | POA: Diagnosis not present

## 2019-12-17 DIAGNOSIS — R7982 Elevated C-reactive protein (CRP): Secondary | ICD-10-CM | POA: Diagnosis not present

## 2019-12-17 DIAGNOSIS — E59 Dietary selenium deficiency: Secondary | ICD-10-CM | POA: Diagnosis not present

## 2019-12-17 DIAGNOSIS — R7303 Prediabetes: Secondary | ICD-10-CM | POA: Diagnosis not present

## 2019-12-23 DIAGNOSIS — Z780 Asymptomatic menopausal state: Secondary | ICD-10-CM | POA: Diagnosis not present

## 2019-12-23 DIAGNOSIS — Z1231 Encounter for screening mammogram for malignant neoplasm of breast: Secondary | ICD-10-CM | POA: Diagnosis not present

## 2019-12-23 DIAGNOSIS — Z13820 Encounter for screening for osteoporosis: Secondary | ICD-10-CM | POA: Diagnosis not present

## 2019-12-23 DIAGNOSIS — M8589 Other specified disorders of bone density and structure, multiple sites: Secondary | ICD-10-CM | POA: Diagnosis not present

## 2019-12-24 DIAGNOSIS — Z124 Encounter for screening for malignant neoplasm of cervix: Secondary | ICD-10-CM | POA: Diagnosis not present

## 2019-12-24 DIAGNOSIS — Z01419 Encounter for gynecological examination (general) (routine) without abnormal findings: Secondary | ICD-10-CM | POA: Diagnosis not present

## 2019-12-24 DIAGNOSIS — N815 Vaginal enterocele: Secondary | ICD-10-CM | POA: Diagnosis not present

## 2019-12-24 DIAGNOSIS — N816 Rectocele: Secondary | ICD-10-CM | POA: Diagnosis not present

## 2020-01-08 ENCOUNTER — Emergency Department (HOSPITAL_COMMUNITY)
Admission: EM | Admit: 2020-01-08 | Discharge: 2020-01-09 | Disposition: A | Discharge status: Home / Self Care | Payer: Medicare Other | Attending: Emergency Medicine | Admitting: Emergency Medicine

## 2020-01-08 ENCOUNTER — Encounter (HOSPITAL_COMMUNITY): Payer: Self-pay

## 2020-01-08 DIAGNOSIS — Z5321 Procedure and treatment not carried out due to patient leaving prior to being seen by health care provider: Secondary | ICD-10-CM | POA: Insufficient documentation

## 2020-01-08 DIAGNOSIS — R1084 Generalized abdominal pain: Secondary | ICD-10-CM | POA: Diagnosis not present

## 2020-01-08 DIAGNOSIS — R112 Nausea with vomiting, unspecified: Secondary | ICD-10-CM | POA: Diagnosis not present

## 2020-01-08 DIAGNOSIS — R1010 Upper abdominal pain, unspecified: Secondary | ICD-10-CM | POA: Diagnosis not present

## 2020-01-08 DIAGNOSIS — R52 Pain, unspecified: Secondary | ICD-10-CM | POA: Diagnosis not present

## 2020-01-08 DIAGNOSIS — R1111 Vomiting without nausea: Secondary | ICD-10-CM | POA: Diagnosis not present

## 2020-01-08 DIAGNOSIS — R110 Nausea: Secondary | ICD-10-CM | POA: Diagnosis not present

## 2020-01-08 LAB — CBC
HCT: 38.2 % (ref 36.0–46.0)
Hemoglobin: 12.6 g/dL (ref 12.0–15.0)
MCH: 30.4 pg (ref 26.0–34.0)
MCHC: 33 g/dL (ref 30.0–36.0)
MCV: 92 fL (ref 80.0–100.0)
Platelets: 255 10*3/uL (ref 150–400)
RBC: 4.15 MIL/uL (ref 3.87–5.11)
RDW: 13.2 % (ref 11.5–15.5)
WBC: 17.1 10*3/uL — ABNORMAL HIGH (ref 4.0–10.5)
nRBC: 0 % (ref 0.0–0.2)

## 2020-01-08 LAB — COMPREHENSIVE METABOLIC PANEL
ALT: 19 U/L (ref 0–44)
AST: 23 U/L (ref 15–41)
Albumin: 3.9 g/dL (ref 3.5–5.0)
Alkaline Phosphatase: 40 U/L (ref 38–126)
Anion gap: 11 (ref 5–15)
BUN: 7 mg/dL — ABNORMAL LOW (ref 8–23)
CO2: 22 mmol/L (ref 22–32)
Calcium: 8.7 mg/dL — ABNORMAL LOW (ref 8.9–10.3)
Chloride: 102 mmol/L (ref 98–111)
Creatinine, Ser: 0.66 mg/dL (ref 0.44–1.00)
GFR calc Af Amer: >60 mL/min (ref >60)
GFR calc non Af Amer: >60 mL/min (ref >60)
Glucose, Bld: 134 mg/dL — ABNORMAL HIGH (ref 70–99)
Potassium: 3.5 mmol/L (ref 3.5–5.1)
Sodium: 135 mmol/L (ref 135–145)
Total Bilirubin: 1.6 mg/dL — ABNORMAL HIGH (ref 0.3–1.2)
Total Protein: 6.9 g/dL (ref 6.5–8.1)

## 2020-01-08 LAB — URINALYSIS, ROUTINE W REFLEX MICROSCOPIC
Bilirubin Urine: NEGATIVE
Glucose, UA: NEGATIVE mg/dL
Hgb urine dipstick: NEGATIVE
Ketones, ur: 5 mg/dL — AB
Leukocytes,Ua: NEGATIVE
Nitrite: NEGATIVE
Protein, ur: NEGATIVE mg/dL
Specific Gravity, Urine: 1.012 (ref 1.005–1.030)
pH: 8 (ref 5.0–8.0)

## 2020-01-08 LAB — LIPASE, BLOOD: Lipase: 22 U/L (ref 11–51)

## 2020-01-08 NOTE — ED Triage Notes (Signed)
Pt comes from home for vomiting today 3 times today and upper abd pain

## 2020-01-09 DIAGNOSIS — R109 Unspecified abdominal pain: Secondary | ICD-10-CM | POA: Diagnosis not present

## 2020-01-09 NOTE — ED Notes (Signed)
Pt states to registration she is leaving 

## 2020-01-14 DIAGNOSIS — Z03818 Encounter for observation for suspected exposure to other biological agents ruled out: Secondary | ICD-10-CM | POA: Diagnosis not present

## 2020-01-14 DIAGNOSIS — R519 Headache, unspecified: Secondary | ICD-10-CM | POA: Diagnosis not present

## 2020-01-14 DIAGNOSIS — R5383 Other fatigue: Secondary | ICD-10-CM | POA: Diagnosis not present

## 2020-01-26 DIAGNOSIS — N816 Rectocele: Secondary | ICD-10-CM | POA: Diagnosis not present

## 2020-01-26 DIAGNOSIS — R3121 Asymptomatic microscopic hematuria: Secondary | ICD-10-CM | POA: Diagnosis not present

## 2020-01-26 DIAGNOSIS — K5900 Constipation, unspecified: Secondary | ICD-10-CM | POA: Diagnosis not present

## 2020-01-26 DIAGNOSIS — N952 Postmenopausal atrophic vaginitis: Secondary | ICD-10-CM | POA: Diagnosis not present

## 2020-01-27 DIAGNOSIS — R891 Abnormal level of hormones in specimens from other organs, systems and tissues: Secondary | ICD-10-CM | POA: Diagnosis not present

## 2020-01-27 DIAGNOSIS — E7800 Pure hypercholesterolemia, unspecified: Secondary | ICD-10-CM | POA: Diagnosis not present

## 2020-01-27 DIAGNOSIS — E618 Deficiency of other specified nutrient elements: Secondary | ICD-10-CM | POA: Diagnosis not present

## 2020-01-27 DIAGNOSIS — N393 Stress incontinence (female) (male): Secondary | ICD-10-CM | POA: Diagnosis not present

## 2020-01-29 DIAGNOSIS — N393 Stress incontinence (female) (male): Secondary | ICD-10-CM | POA: Diagnosis not present

## 2020-01-29 DIAGNOSIS — R351 Nocturia: Secondary | ICD-10-CM | POA: Diagnosis not present

## 2020-01-29 DIAGNOSIS — N816 Rectocele: Secondary | ICD-10-CM | POA: Diagnosis not present

## 2020-01-29 DIAGNOSIS — K5900 Constipation, unspecified: Secondary | ICD-10-CM | POA: Diagnosis not present

## 2020-01-29 DIAGNOSIS — N952 Postmenopausal atrophic vaginitis: Secondary | ICD-10-CM | POA: Diagnosis not present

## 2020-03-05 ENCOUNTER — Other Ambulatory Visit: Payer: Self-pay | Admitting: Family Medicine

## 2020-03-05 ENCOUNTER — Other Ambulatory Visit: Payer: Self-pay

## 2020-03-05 ENCOUNTER — Ambulatory Visit
Admission: RE | Admit: 2020-03-05 | Discharge: 2020-03-05 | Disposition: A | Discharge status: Home / Self Care | Payer: Medicare Other | Source: Ambulatory Visit | Attending: Family Medicine | Admitting: Family Medicine

## 2020-03-05 DIAGNOSIS — R109 Unspecified abdominal pain: Secondary | ICD-10-CM

## 2020-03-05 DIAGNOSIS — N858 Other specified noninflammatory disorders of uterus: Secondary | ICD-10-CM | POA: Diagnosis not present

## 2020-03-05 DIAGNOSIS — K449 Diaphragmatic hernia without obstruction or gangrene: Secondary | ICD-10-CM | POA: Diagnosis not present

## 2020-03-05 MED ORDER — IOPAMIDOL (ISOVUE-300) INJECTION 61%
100.0000 mL | Freq: Once | INTRAVENOUS | Status: AC | PRN
  Administered 2020-03-05: 100 mL via INTRAVENOUS

## 2020-03-18 IMAGING — MG DIGITAL SCREENING BILATERAL MAMMOGRAM WITH TOMO AND CAD
8 series · 9 of 24 positions shown · non-contrast
Comparison: Previous exam(s).

CLINICAL DATA: Screening.

EXAM:
DIGITAL SCREENING BILATERAL MAMMOGRAM WITH TOMO AND CAD

[L CC synth-2D]
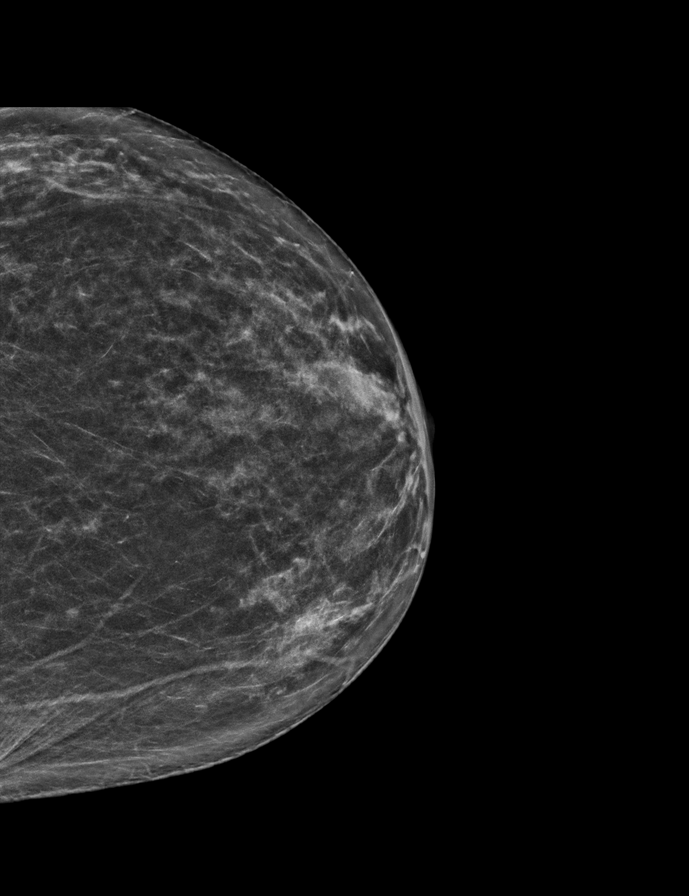

[R MLO synth-2D]
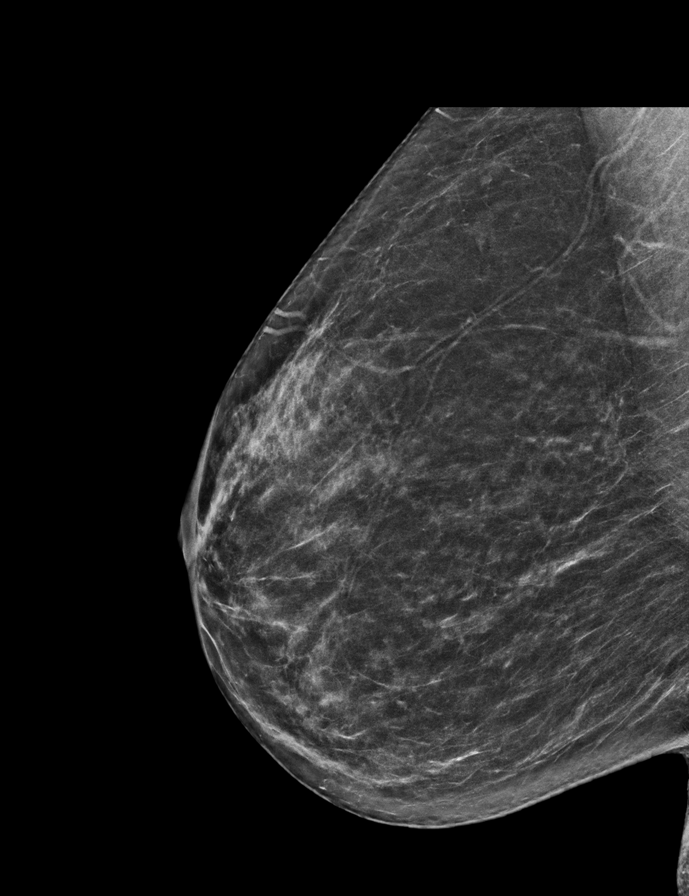

[L MLO synth-2D]
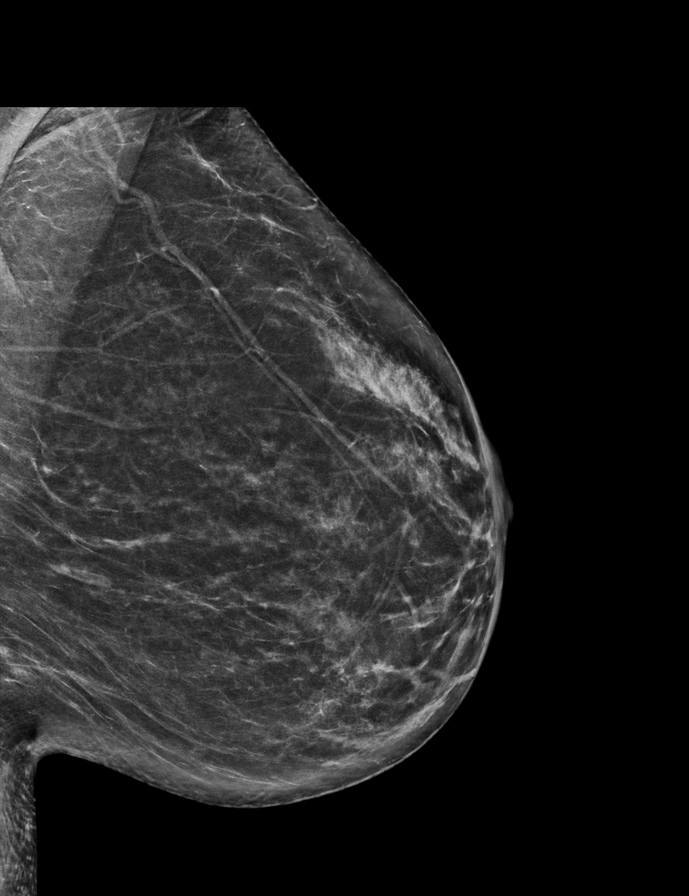

[R CC synth-2D]
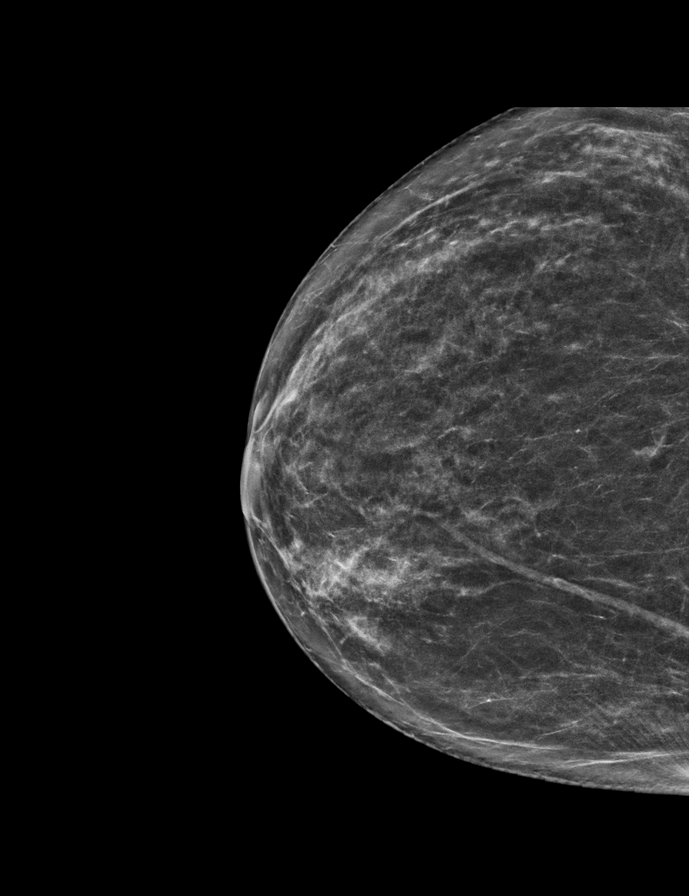

[L CC tomo · 2 of 59 frames shown]
[frame 20/59]
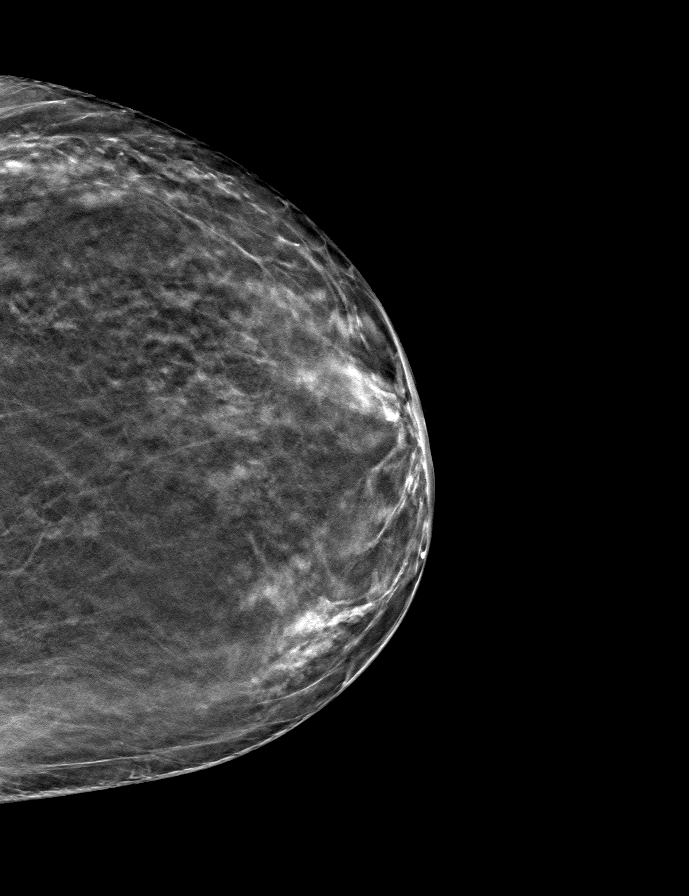
[frame 30/59]
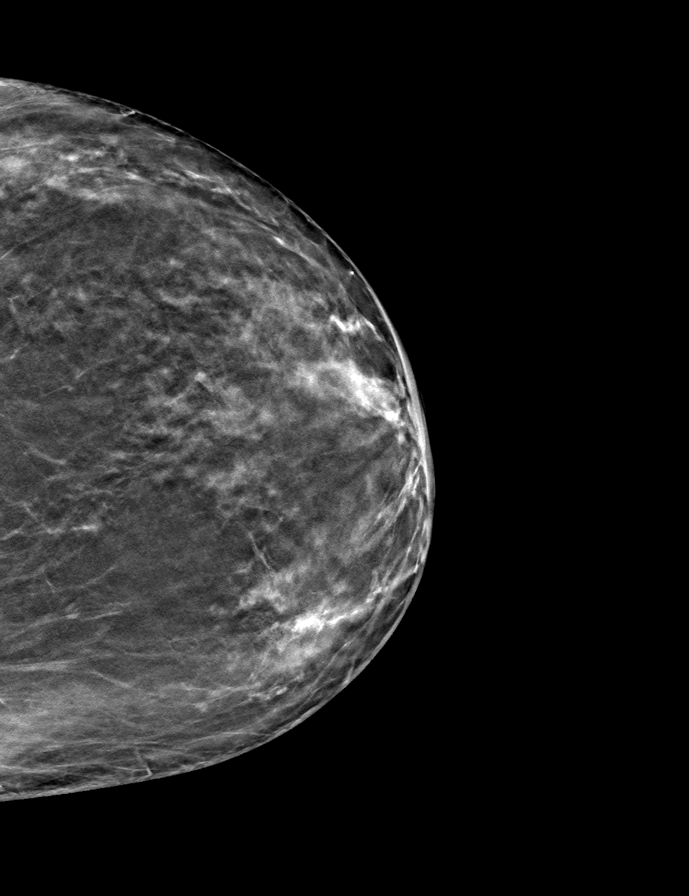

[R CC tomo · tomo slice 31/61.0]
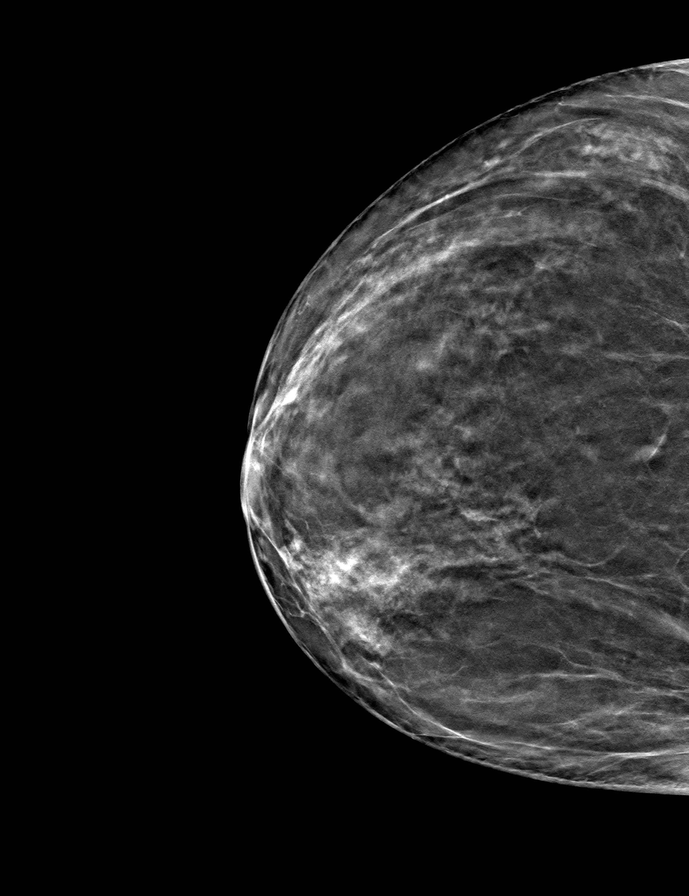

[L MLO tomo · tomo slice 33/64.0]
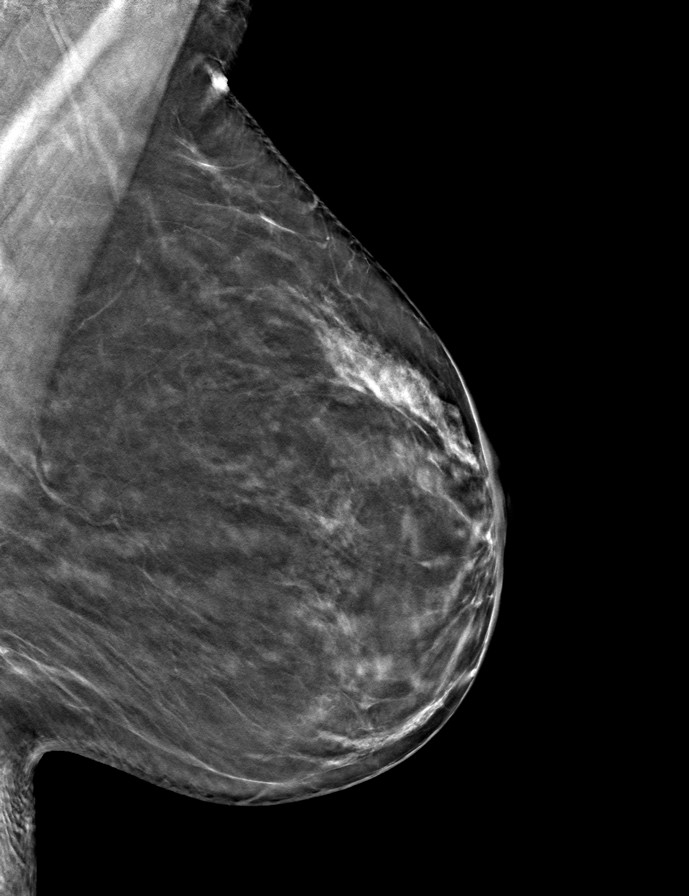

[R MLO tomo · tomo slice 31/61.0]
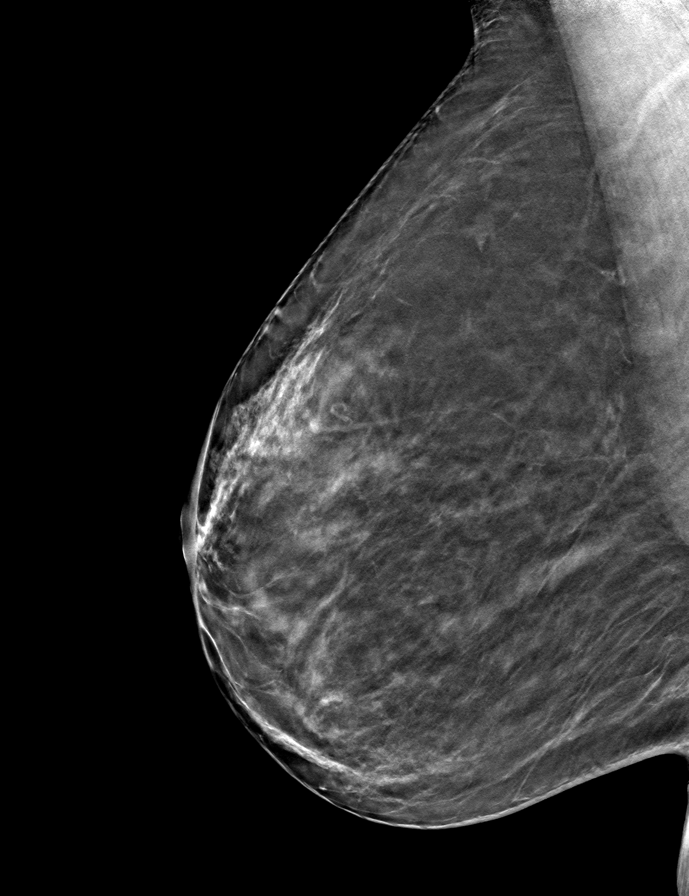

[9 of 24 positions shown; findings below may reference images not displayed]

ACR Breast Density Category b: There are scattered areas of
fibroglandular density.
FINDINGS: There are no findings suspicious for malignancy. Images were
processed with CAD.
IMPRESSION: No mammographic evidence of malignancy. A result letter of this
screening mammogram will be mailed directly to the patient.

RECOMMENDATION:
Screening mammogram in one year. (Code:[id number])

BI-RADS CATEGORY  1: Negative.

## 2020-03-23 DIAGNOSIS — N858 Other specified noninflammatory disorders of uterus: Secondary | ICD-10-CM | POA: Diagnosis not present

## 2020-03-23 DIAGNOSIS — R9389 Abnormal findings on diagnostic imaging of other specified body structures: Secondary | ICD-10-CM | POA: Diagnosis not present

## 2020-03-30 DIAGNOSIS — N816 Rectocele: Secondary | ICD-10-CM | POA: Diagnosis not present

## 2020-03-30 DIAGNOSIS — R9389 Abnormal findings on diagnostic imaging of other specified body structures: Secondary | ICD-10-CM | POA: Diagnosis not present

## 2020-03-30 DIAGNOSIS — N952 Postmenopausal atrophic vaginitis: Secondary | ICD-10-CM | POA: Diagnosis not present

## 2020-03-30 DIAGNOSIS — N393 Stress incontinence (female) (male): Secondary | ICD-10-CM | POA: Diagnosis not present

## 2020-03-31 ENCOUNTER — Ambulatory Visit: Payer: Medicare Other | Admitting: Obstetrics & Gynecology

## 2020-04-10 DIAGNOSIS — Z20822 Contact with and (suspected) exposure to covid-19: Secondary | ICD-10-CM | POA: Diagnosis not present

## 2020-04-13 DIAGNOSIS — Z881 Allergy status to other antibiotic agents status: Secondary | ICD-10-CM | POA: Diagnosis not present

## 2020-04-13 DIAGNOSIS — Z87891 Personal history of nicotine dependence: Secondary | ICD-10-CM | POA: Diagnosis not present

## 2020-04-13 DIAGNOSIS — R011 Cardiac murmur, unspecified: Secondary | ICD-10-CM | POA: Diagnosis not present

## 2020-04-13 DIAGNOSIS — N840 Polyp of corpus uteri: Secondary | ICD-10-CM | POA: Diagnosis not present

## 2020-04-13 DIAGNOSIS — R9389 Abnormal findings on diagnostic imaging of other specified body structures: Secondary | ICD-10-CM | POA: Diagnosis not present

## 2020-05-05 DIAGNOSIS — Z79899 Other long term (current) drug therapy: Secondary | ICD-10-CM | POA: Diagnosis not present

## 2020-05-05 DIAGNOSIS — B351 Tinea unguium: Secondary | ICD-10-CM | POA: Diagnosis not present

## 2020-05-05 DIAGNOSIS — L239 Allergic contact dermatitis, unspecified cause: Secondary | ICD-10-CM | POA: Diagnosis not present

## 2020-05-05 DIAGNOSIS — L821 Other seborrheic keratosis: Secondary | ICD-10-CM | POA: Diagnosis not present

## 2020-05-12 DIAGNOSIS — N816 Rectocele: Secondary | ICD-10-CM | POA: Diagnosis not present

## 2020-05-12 DIAGNOSIS — N393 Stress incontinence (female) (male): Secondary | ICD-10-CM | POA: Diagnosis not present

## 2020-05-12 DIAGNOSIS — Z09 Encounter for follow-up examination after completed treatment for conditions other than malignant neoplasm: Secondary | ICD-10-CM | POA: Diagnosis not present

## 2020-05-12 DIAGNOSIS — R351 Nocturia: Secondary | ICD-10-CM | POA: Diagnosis not present

## 2020-05-12 DIAGNOSIS — K5900 Constipation, unspecified: Secondary | ICD-10-CM | POA: Diagnosis not present

## 2020-05-12 DIAGNOSIS — N952 Postmenopausal atrophic vaginitis: Secondary | ICD-10-CM | POA: Diagnosis not present

## 2020-10-29 DIAGNOSIS — R079 Chest pain, unspecified: Secondary | ICD-10-CM | POA: Diagnosis not present

## 2020-11-25 DIAGNOSIS — K224 Dyskinesia of esophagus: Secondary | ICD-10-CM | POA: Diagnosis not present

## 2020-11-25 DIAGNOSIS — R079 Chest pain, unspecified: Secondary | ICD-10-CM | POA: Diagnosis not present

## 2020-11-25 DIAGNOSIS — K449 Diaphragmatic hernia without obstruction or gangrene: Secondary | ICD-10-CM | POA: Diagnosis not present

## 2020-12-10 DIAGNOSIS — B009 Herpesviral infection, unspecified: Secondary | ICD-10-CM | POA: Diagnosis not present

## 2020-12-10 DIAGNOSIS — H9202 Otalgia, left ear: Secondary | ICD-10-CM | POA: Diagnosis not present

## 2020-12-27 DIAGNOSIS — R519 Headache, unspecified: Secondary | ICD-10-CM | POA: Diagnosis not present

## 2020-12-27 DIAGNOSIS — K449 Diaphragmatic hernia without obstruction or gangrene: Secondary | ICD-10-CM | POA: Diagnosis not present

## 2020-12-27 DIAGNOSIS — K2289 Other specified disease of esophagus: Secondary | ICD-10-CM | POA: Diagnosis not present

## 2020-12-27 DIAGNOSIS — K219 Gastro-esophageal reflux disease without esophagitis: Secondary | ICD-10-CM | POA: Diagnosis not present

## 2020-12-27 DIAGNOSIS — R079 Chest pain, unspecified: Secondary | ICD-10-CM | POA: Diagnosis not present

## 2020-12-27 DIAGNOSIS — Z881 Allergy status to other antibiotic agents status: Secondary | ICD-10-CM | POA: Diagnosis not present

## 2020-12-27 DIAGNOSIS — Z8616 Personal history of COVID-19: Secondary | ICD-10-CM | POA: Diagnosis not present

## 2020-12-27 DIAGNOSIS — R1314 Dysphagia, pharyngoesophageal phase: Secondary | ICD-10-CM | POA: Diagnosis not present

## 2020-12-27 DIAGNOSIS — R011 Cardiac murmur, unspecified: Secondary | ICD-10-CM | POA: Diagnosis not present

## 2020-12-27 DIAGNOSIS — R1310 Dysphagia, unspecified: Secondary | ICD-10-CM | POA: Diagnosis not present

## 2020-12-27 DIAGNOSIS — Z79899 Other long term (current) drug therapy: Secondary | ICD-10-CM | POA: Diagnosis not present

## 2021-01-03 DIAGNOSIS — M545 Low back pain, unspecified: Secondary | ICD-10-CM | POA: Diagnosis not present

## 2021-01-03 DIAGNOSIS — R7303 Prediabetes: Secondary | ICD-10-CM | POA: Diagnosis not present

## 2021-01-03 DIAGNOSIS — E611 Iron deficiency: Secondary | ICD-10-CM | POA: Diagnosis not present

## 2021-01-03 DIAGNOSIS — M9902 Segmental and somatic dysfunction of thoracic region: Secondary | ICD-10-CM | POA: Diagnosis not present

## 2021-01-03 DIAGNOSIS — M7918 Myalgia, other site: Secondary | ICD-10-CM | POA: Diagnosis not present

## 2021-01-03 DIAGNOSIS — E038 Other specified hypothyroidism: Secondary | ICD-10-CM | POA: Diagnosis not present

## 2021-01-03 DIAGNOSIS — M9903 Segmental and somatic dysfunction of lumbar region: Secondary | ICD-10-CM | POA: Diagnosis not present

## 2021-01-03 DIAGNOSIS — G43111 Migraine with aura, intractable, with status migrainosus: Secondary | ICD-10-CM | POA: Diagnosis not present

## 2021-01-03 DIAGNOSIS — E531 Pyridoxine deficiency: Secondary | ICD-10-CM | POA: Diagnosis not present

## 2021-01-03 DIAGNOSIS — E559 Vitamin D deficiency, unspecified: Secondary | ICD-10-CM | POA: Diagnosis not present

## 2021-01-03 DIAGNOSIS — D513 Other dietary vitamin B12 deficiency anemia: Secondary | ICD-10-CM | POA: Diagnosis not present

## 2021-01-03 DIAGNOSIS — E063 Autoimmune thyroiditis: Secondary | ICD-10-CM | POA: Diagnosis not present

## 2021-01-03 DIAGNOSIS — E568 Deficiency of other vitamins: Secondary | ICD-10-CM | POA: Diagnosis not present

## 2021-01-03 DIAGNOSIS — M542 Cervicalgia: Secondary | ICD-10-CM | POA: Diagnosis not present

## 2021-01-03 DIAGNOSIS — M546 Pain in thoracic spine: Secondary | ICD-10-CM | POA: Diagnosis not present

## 2021-01-03 DIAGNOSIS — R5383 Other fatigue: Secondary | ICD-10-CM | POA: Diagnosis not present

## 2021-01-03 DIAGNOSIS — E7211 Homocystinuria: Secondary | ICD-10-CM | POA: Diagnosis not present

## 2021-01-03 DIAGNOSIS — E782 Mixed hyperlipidemia: Secondary | ICD-10-CM | POA: Diagnosis not present

## 2021-01-03 DIAGNOSIS — M7912 Myalgia of auxiliary muscles, head and neck: Secondary | ICD-10-CM | POA: Diagnosis not present

## 2021-01-03 DIAGNOSIS — M5136 Other intervertebral disc degeneration, lumbar region: Secondary | ICD-10-CM | POA: Diagnosis not present

## 2021-01-03 DIAGNOSIS — D520 Dietary folate deficiency anemia: Secondary | ICD-10-CM | POA: Diagnosis not present

## 2021-01-03 DIAGNOSIS — M9904 Segmental and somatic dysfunction of sacral region: Secondary | ICD-10-CM | POA: Diagnosis not present

## 2021-01-03 DIAGNOSIS — M9901 Segmental and somatic dysfunction of cervical region: Secondary | ICD-10-CM | POA: Diagnosis not present

## 2021-01-07 DIAGNOSIS — G5751 Tarsal tunnel syndrome, right lower limb: Secondary | ICD-10-CM | POA: Diagnosis not present

## 2021-01-07 DIAGNOSIS — M2042 Other hammer toe(s) (acquired), left foot: Secondary | ICD-10-CM | POA: Diagnosis not present

## 2021-01-07 DIAGNOSIS — G5762 Lesion of plantar nerve, left lower limb: Secondary | ICD-10-CM | POA: Diagnosis not present

## 2021-01-07 DIAGNOSIS — G5752 Tarsal tunnel syndrome, left lower limb: Secondary | ICD-10-CM | POA: Diagnosis not present

## 2021-01-07 DIAGNOSIS — M2041 Other hammer toe(s) (acquired), right foot: Secondary | ICD-10-CM | POA: Diagnosis not present

## 2021-01-12 DIAGNOSIS — M7918 Myalgia, other site: Secondary | ICD-10-CM | POA: Diagnosis not present

## 2021-01-12 DIAGNOSIS — M9901 Segmental and somatic dysfunction of cervical region: Secondary | ICD-10-CM | POA: Diagnosis not present

## 2021-01-12 DIAGNOSIS — G43111 Migraine with aura, intractable, with status migrainosus: Secondary | ICD-10-CM | POA: Diagnosis not present

## 2021-01-12 DIAGNOSIS — M9904 Segmental and somatic dysfunction of sacral region: Secondary | ICD-10-CM | POA: Diagnosis not present

## 2021-01-12 DIAGNOSIS — M9903 Segmental and somatic dysfunction of lumbar region: Secondary | ICD-10-CM | POA: Diagnosis not present

## 2021-01-12 DIAGNOSIS — M9902 Segmental and somatic dysfunction of thoracic region: Secondary | ICD-10-CM | POA: Diagnosis not present

## 2021-01-12 DIAGNOSIS — M5136 Other intervertebral disc degeneration, lumbar region: Secondary | ICD-10-CM | POA: Diagnosis not present

## 2021-01-12 DIAGNOSIS — M542 Cervicalgia: Secondary | ICD-10-CM | POA: Diagnosis not present

## 2021-01-12 DIAGNOSIS — M545 Low back pain, unspecified: Secondary | ICD-10-CM | POA: Diagnosis not present

## 2021-01-12 DIAGNOSIS — M546 Pain in thoracic spine: Secondary | ICD-10-CM | POA: Diagnosis not present

## 2021-01-12 DIAGNOSIS — M7912 Myalgia of auxiliary muscles, head and neck: Secondary | ICD-10-CM | POA: Diagnosis not present

## 2021-01-17 DIAGNOSIS — R7303 Prediabetes: Secondary | ICD-10-CM | POA: Diagnosis not present

## 2021-01-17 DIAGNOSIS — R891 Abnormal level of hormones in specimens from other organs, systems and tissues: Secondary | ICD-10-CM | POA: Diagnosis not present

## 2021-01-17 DIAGNOSIS — E559 Vitamin D deficiency, unspecified: Secondary | ICD-10-CM | POA: Diagnosis not present

## 2021-01-17 DIAGNOSIS — E063 Autoimmune thyroiditis: Secondary | ICD-10-CM | POA: Diagnosis not present

## 2021-01-17 DIAGNOSIS — E630 Essential fatty acid [EFA] deficiency: Secondary | ICD-10-CM | POA: Diagnosis not present

## 2021-01-17 DIAGNOSIS — D513 Other dietary vitamin B12 deficiency anemia: Secondary | ICD-10-CM | POA: Diagnosis not present

## 2021-01-17 DIAGNOSIS — D520 Dietary folate deficiency anemia: Secondary | ICD-10-CM | POA: Diagnosis not present

## 2021-01-17 DIAGNOSIS — E038 Other specified hypothyroidism: Secondary | ICD-10-CM | POA: Diagnosis not present

## 2021-01-17 DIAGNOSIS — E531 Pyridoxine deficiency: Secondary | ICD-10-CM | POA: Diagnosis not present

## 2021-01-17 DIAGNOSIS — E7211 Homocystinuria: Secondary | ICD-10-CM | POA: Diagnosis not present

## 2021-01-17 DIAGNOSIS — E568 Deficiency of other vitamins: Secondary | ICD-10-CM | POA: Diagnosis not present

## 2021-01-17 DIAGNOSIS — E611 Iron deficiency: Secondary | ICD-10-CM | POA: Diagnosis not present

## 2021-01-31 DIAGNOSIS — M9904 Segmental and somatic dysfunction of sacral region: Secondary | ICD-10-CM | POA: Diagnosis not present

## 2021-01-31 DIAGNOSIS — M7912 Myalgia of auxiliary muscles, head and neck: Secondary | ICD-10-CM | POA: Diagnosis not present

## 2021-01-31 DIAGNOSIS — M9901 Segmental and somatic dysfunction of cervical region: Secondary | ICD-10-CM | POA: Diagnosis not present

## 2021-01-31 DIAGNOSIS — M542 Cervicalgia: Secondary | ICD-10-CM | POA: Diagnosis not present

## 2021-01-31 DIAGNOSIS — G43111 Migraine with aura, intractable, with status migrainosus: Secondary | ICD-10-CM | POA: Diagnosis not present

## 2021-01-31 DIAGNOSIS — M9902 Segmental and somatic dysfunction of thoracic region: Secondary | ICD-10-CM | POA: Diagnosis not present

## 2021-01-31 DIAGNOSIS — M545 Low back pain, unspecified: Secondary | ICD-10-CM | POA: Diagnosis not present

## 2021-01-31 DIAGNOSIS — M546 Pain in thoracic spine: Secondary | ICD-10-CM | POA: Diagnosis not present

## 2021-01-31 DIAGNOSIS — M5136 Other intervertebral disc degeneration, lumbar region: Secondary | ICD-10-CM | POA: Diagnosis not present

## 2021-01-31 DIAGNOSIS — M9903 Segmental and somatic dysfunction of lumbar region: Secondary | ICD-10-CM | POA: Diagnosis not present

## 2021-01-31 DIAGNOSIS — M7918 Myalgia, other site: Secondary | ICD-10-CM | POA: Diagnosis not present

## 2021-02-02 DIAGNOSIS — R0789 Other chest pain: Secondary | ICD-10-CM | POA: Diagnosis not present

## 2021-02-02 DIAGNOSIS — K224 Dyskinesia of esophagus: Secondary | ICD-10-CM | POA: Diagnosis not present

## 2021-02-07 DIAGNOSIS — M545 Low back pain, unspecified: Secondary | ICD-10-CM | POA: Diagnosis not present

## 2021-02-07 DIAGNOSIS — G43111 Migraine with aura, intractable, with status migrainosus: Secondary | ICD-10-CM | POA: Diagnosis not present

## 2021-02-07 DIAGNOSIS — M546 Pain in thoracic spine: Secondary | ICD-10-CM | POA: Diagnosis not present

## 2021-02-07 DIAGNOSIS — M9904 Segmental and somatic dysfunction of sacral region: Secondary | ICD-10-CM | POA: Diagnosis not present

## 2021-02-07 DIAGNOSIS — M9901 Segmental and somatic dysfunction of cervical region: Secondary | ICD-10-CM | POA: Diagnosis not present

## 2021-02-07 DIAGNOSIS — M7918 Myalgia, other site: Secondary | ICD-10-CM | POA: Diagnosis not present

## 2021-02-07 DIAGNOSIS — M5136 Other intervertebral disc degeneration, lumbar region: Secondary | ICD-10-CM | POA: Diagnosis not present

## 2021-02-07 DIAGNOSIS — M9903 Segmental and somatic dysfunction of lumbar region: Secondary | ICD-10-CM | POA: Diagnosis not present

## 2021-02-07 DIAGNOSIS — M7912 Myalgia of auxiliary muscles, head and neck: Secondary | ICD-10-CM | POA: Diagnosis not present

## 2021-02-07 DIAGNOSIS — M9902 Segmental and somatic dysfunction of thoracic region: Secondary | ICD-10-CM | POA: Diagnosis not present

## 2021-02-07 DIAGNOSIS — M542 Cervicalgia: Secondary | ICD-10-CM | POA: Diagnosis not present

## 2021-02-10 DIAGNOSIS — R7303 Prediabetes: Secondary | ICD-10-CM | POA: Diagnosis not present

## 2021-02-10 DIAGNOSIS — R790 Abnormal level of blood mineral: Secondary | ICD-10-CM | POA: Diagnosis not present

## 2021-02-10 DIAGNOSIS — E618 Deficiency of other specified nutrient elements: Secondary | ICD-10-CM | POA: Diagnosis not present

## 2021-02-10 DIAGNOSIS — E782 Mixed hyperlipidemia: Secondary | ICD-10-CM | POA: Diagnosis not present

## 2021-02-10 DIAGNOSIS — R7989 Other specified abnormal findings of blood chemistry: Secondary | ICD-10-CM | POA: Diagnosis not present

## 2021-04-05 DIAGNOSIS — Z1231 Encounter for screening mammogram for malignant neoplasm of breast: Secondary | ICD-10-CM | POA: Diagnosis not present

## 2021-05-16 DIAGNOSIS — N816 Rectocele: Secondary | ICD-10-CM | POA: Diagnosis not present

## 2021-05-16 DIAGNOSIS — K5900 Constipation, unspecified: Secondary | ICD-10-CM | POA: Diagnosis not present

## 2021-08-25 DIAGNOSIS — H1013 Acute atopic conjunctivitis, bilateral: Secondary | ICD-10-CM | POA: Diagnosis not present

## 2021-09-27 DIAGNOSIS — Z1330 Encounter for screening examination for mental health and behavioral disorders, unspecified: Secondary | ICD-10-CM | POA: Diagnosis not present

## 2021-09-27 DIAGNOSIS — I471 Supraventricular tachycardia: Secondary | ICD-10-CM | POA: Diagnosis not present

## 2021-09-27 DIAGNOSIS — Z1331 Encounter for screening for depression: Secondary | ICD-10-CM | POA: Diagnosis not present

## 2021-09-27 DIAGNOSIS — K449 Diaphragmatic hernia without obstruction or gangrene: Secondary | ICD-10-CM | POA: Diagnosis not present

## 2021-09-27 DIAGNOSIS — Z8249 Family history of ischemic heart disease and other diseases of the circulatory system: Secondary | ICD-10-CM | POA: Diagnosis not present

## 2021-09-27 DIAGNOSIS — N816 Rectocele: Secondary | ICD-10-CM | POA: Diagnosis not present

## 2021-09-27 DIAGNOSIS — Z79899 Other long term (current) drug therapy: Secondary | ICD-10-CM | POA: Diagnosis not present

## 2021-11-07 DIAGNOSIS — I471 Supraventricular tachycardia: Secondary | ICD-10-CM | POA: Diagnosis not present

## 2021-11-14 DIAGNOSIS — R0989 Other specified symptoms and signs involving the circulatory and respiratory systems: Secondary | ICD-10-CM | POA: Diagnosis not present

## 2021-11-14 DIAGNOSIS — I471 Supraventricular tachycardia: Secondary | ICD-10-CM | POA: Diagnosis not present

## 2021-11-14 DIAGNOSIS — R002 Palpitations: Secondary | ICD-10-CM | POA: Diagnosis not present

## 2021-11-18 DIAGNOSIS — N816 Rectocele: Secondary | ICD-10-CM | POA: Diagnosis not present

## 2021-11-24 DIAGNOSIS — Z1211 Encounter for screening for malignant neoplasm of colon: Secondary | ICD-10-CM | POA: Diagnosis not present

## 2021-11-24 DIAGNOSIS — D125 Benign neoplasm of sigmoid colon: Secondary | ICD-10-CM | POA: Diagnosis not present

## 2021-11-24 DIAGNOSIS — Z8616 Personal history of COVID-19: Secondary | ICD-10-CM | POA: Diagnosis not present

## 2021-11-24 DIAGNOSIS — K635 Polyp of colon: Secondary | ICD-10-CM | POA: Diagnosis not present

## 2021-11-24 DIAGNOSIS — D126 Benign neoplasm of colon, unspecified: Secondary | ICD-10-CM | POA: Diagnosis not present

## 2021-11-24 DIAGNOSIS — K648 Other hemorrhoids: Secondary | ICD-10-CM | POA: Diagnosis not present

## 2021-11-24 DIAGNOSIS — D122 Benign neoplasm of ascending colon: Secondary | ICD-10-CM | POA: Diagnosis not present

## 2021-11-24 DIAGNOSIS — Z79899 Other long term (current) drug therapy: Secondary | ICD-10-CM | POA: Diagnosis not present

## 2021-11-24 DIAGNOSIS — K6389 Other specified diseases of intestine: Secondary | ICD-10-CM | POA: Diagnosis not present

## 2021-11-25 DIAGNOSIS — D125 Benign neoplasm of sigmoid colon: Secondary | ICD-10-CM | POA: Diagnosis not present

## 2021-11-25 DIAGNOSIS — Z1211 Encounter for screening for malignant neoplasm of colon: Secondary | ICD-10-CM | POA: Diagnosis not present

## 2021-11-25 DIAGNOSIS — D122 Benign neoplasm of ascending colon: Secondary | ICD-10-CM | POA: Diagnosis not present

## 2021-12-07 DIAGNOSIS — R0989 Other specified symptoms and signs involving the circulatory and respiratory systems: Secondary | ICD-10-CM | POA: Diagnosis not present

## 2021-12-10 DIAGNOSIS — R002 Palpitations: Secondary | ICD-10-CM | POA: Diagnosis not present

## 2022-01-02 DIAGNOSIS — L570 Actinic keratosis: Secondary | ICD-10-CM | POA: Diagnosis not present

## 2022-01-02 DIAGNOSIS — L538 Other specified erythematous conditions: Secondary | ICD-10-CM | POA: Diagnosis not present

## 2022-01-02 DIAGNOSIS — L820 Inflamed seborrheic keratosis: Secondary | ICD-10-CM | POA: Diagnosis not present

## 2022-01-12 DIAGNOSIS — Z0000 Encounter for general adult medical examination without abnormal findings: Secondary | ICD-10-CM | POA: Diagnosis not present

## 2022-01-12 DIAGNOSIS — Z1331 Encounter for screening for depression: Secondary | ICD-10-CM | POA: Diagnosis not present

## 2022-01-12 DIAGNOSIS — Z780 Asymptomatic menopausal state: Secondary | ICD-10-CM | POA: Diagnosis not present

## 2022-01-12 DIAGNOSIS — M8580 Other specified disorders of bone density and structure, unspecified site: Secondary | ICD-10-CM | POA: Diagnosis not present

## 2022-01-12 DIAGNOSIS — J0100 Acute maxillary sinusitis, unspecified: Secondary | ICD-10-CM | POA: Diagnosis not present

## 2022-01-12 DIAGNOSIS — Z1330 Encounter for screening examination for mental health and behavioral disorders, unspecified: Secondary | ICD-10-CM | POA: Diagnosis not present

## 2022-01-12 DIAGNOSIS — U071 COVID-19: Secondary | ICD-10-CM | POA: Diagnosis not present

## 2022-02-27 DIAGNOSIS — R7303 Prediabetes: Secondary | ICD-10-CM | POA: Diagnosis not present

## 2022-02-27 DIAGNOSIS — N951 Menopausal and female climacteric states: Secondary | ICD-10-CM | POA: Diagnosis not present

## 2022-02-27 DIAGNOSIS — Z1239 Encounter for other screening for malignant neoplasm of breast: Secondary | ICD-10-CM | POA: Diagnosis not present

## 2022-02-27 DIAGNOSIS — E559 Vitamin D deficiency, unspecified: Secondary | ICD-10-CM | POA: Diagnosis not present

## 2022-02-27 DIAGNOSIS — E876 Hypokalemia: Secondary | ICD-10-CM | POA: Diagnosis not present

## 2022-02-27 DIAGNOSIS — N816 Rectocele: Secondary | ICD-10-CM | POA: Diagnosis not present

## 2022-02-27 DIAGNOSIS — E039 Hypothyroidism, unspecified: Secondary | ICD-10-CM | POA: Diagnosis not present

## 2022-02-27 DIAGNOSIS — D529 Folate deficiency anemia, unspecified: Secondary | ICD-10-CM | POA: Diagnosis not present

## 2022-02-27 DIAGNOSIS — E539 Vitamin B deficiency, unspecified: Secondary | ICD-10-CM | POA: Diagnosis not present

## 2022-03-28 DIAGNOSIS — E539 Vitamin B deficiency, unspecified: Secondary | ICD-10-CM | POA: Diagnosis not present

## 2022-03-28 DIAGNOSIS — E7800 Pure hypercholesterolemia, unspecified: Secondary | ICD-10-CM | POA: Diagnosis not present

## 2022-03-28 DIAGNOSIS — E559 Vitamin D deficiency, unspecified: Secondary | ICD-10-CM | POA: Diagnosis not present

## 2022-03-28 DIAGNOSIS — L659 Nonscarring hair loss, unspecified: Secondary | ICD-10-CM | POA: Diagnosis not present

## 2022-03-28 DIAGNOSIS — R7303 Prediabetes: Secondary | ICD-10-CM | POA: Diagnosis not present

## 2022-04-06 DIAGNOSIS — Z1231 Encounter for screening mammogram for malignant neoplasm of breast: Secondary | ICD-10-CM | POA: Diagnosis not present

## 2024-06-12 ENCOUNTER — Other Ambulatory Visit: Payer: Self-pay | Admitting: Family Medicine

## 2024-06-12 DIAGNOSIS — Z1231 Encounter for screening mammogram for malignant neoplasm of breast: Secondary | ICD-10-CM

## 2024-06-12 DIAGNOSIS — Z780 Asymptomatic menopausal state: Secondary | ICD-10-CM
